# Patient Record
Sex: Male | Born: 1970 | Race: White | Hispanic: No | Marital: Married | Smoking: Former smoker
Health system: Southern US, Community
[De-identification: ages and names within clinical notes are randomized; demographics above are authoritative.]

## PROBLEM LIST (undated history)

## (undated) ENCOUNTER — Ambulatory Visit

## (undated) DIAGNOSIS — E119 Type 2 diabetes mellitus without complications: Secondary | ICD-10-CM

## (undated) DIAGNOSIS — F32A Depression, unspecified: Secondary | ICD-10-CM

## (undated) DIAGNOSIS — K219 Gastro-esophageal reflux disease without esophagitis: Secondary | ICD-10-CM

## (undated) DIAGNOSIS — I10 Essential (primary) hypertension: Secondary | ICD-10-CM

## (undated) DIAGNOSIS — F329 Major depressive disorder, single episode, unspecified: Secondary | ICD-10-CM

## (undated) DIAGNOSIS — E7800 Pure hypercholesterolemia, unspecified: Secondary | ICD-10-CM

## (undated) DIAGNOSIS — F419 Anxiety disorder, unspecified: Secondary | ICD-10-CM

## (undated) DIAGNOSIS — Z8719 Personal history of other diseases of the digestive system: Secondary | ICD-10-CM

## (undated) HISTORY — PX: HERNIA REPAIR: SHX51

## (undated) HISTORY — DX: Type 2 diabetes mellitus without complications: E11.9

## (undated) HISTORY — PX: CHOLECYSTECTOMY: SHX55

---

## 1995-01-09 HISTORY — PX: APPENDECTOMY: SHX54

## 1998-06-14 ENCOUNTER — Encounter: Payer: Self-pay | Admitting: Orthopedic Surgery

## 1998-06-14 ENCOUNTER — Ambulatory Visit (HOSPITAL_COMMUNITY): Admission: RE | Admit: 1998-06-14 | Discharge: 1998-06-14 | Payer: Self-pay | Admitting: Orthopedic Surgery

## 1998-07-05 ENCOUNTER — Ambulatory Visit (HOSPITAL_COMMUNITY): Admission: RE | Admit: 1998-07-05 | Discharge: 1998-07-05 | Payer: Self-pay | Admitting: Orthopedic Surgery

## 1998-07-05 ENCOUNTER — Encounter: Payer: Self-pay | Admitting: Orthopedic Surgery

## 1998-07-19 ENCOUNTER — Encounter: Payer: Self-pay | Admitting: Orthopedic Surgery

## 1998-07-19 ENCOUNTER — Ambulatory Visit (HOSPITAL_COMMUNITY): Admission: RE | Admit: 1998-07-19 | Discharge: 1998-07-19 | Payer: Self-pay | Admitting: Orthopedic Surgery

## 2003-03-02 ENCOUNTER — Emergency Department (HOSPITAL_COMMUNITY): Admission: EM | Admit: 2003-03-02 | Discharge: 2003-03-02 | Payer: Self-pay | Admitting: *Deleted

## 2004-01-13 ENCOUNTER — Encounter: Admission: RE | Admit: 2004-01-13 | Discharge: 2004-01-13 | Payer: Self-pay | Admitting: Internal Medicine

## 2004-03-10 ENCOUNTER — Emergency Department (HOSPITAL_COMMUNITY): Admission: EM | Admit: 2004-03-10 | Discharge: 2004-03-10 | Payer: Self-pay | Admitting: Emergency Medicine

## 2004-03-23 ENCOUNTER — Ambulatory Visit (HOSPITAL_COMMUNITY): Admission: RE | Admit: 2004-03-23 | Discharge: 2004-03-23 | Payer: Self-pay | Admitting: General Surgery

## 2007-07-24 ENCOUNTER — Emergency Department (HOSPITAL_COMMUNITY): Admission: EM | Admit: 2007-07-24 | Discharge: 2007-07-24 | Payer: Self-pay | Admitting: Emergency Medicine

## 2008-08-19 ENCOUNTER — Emergency Department (HOSPITAL_COMMUNITY): Admission: EM | Admit: 2008-08-19 | Discharge: 2008-08-19 | Payer: Self-pay | Admitting: Emergency Medicine

## 2010-04-15 LAB — DIFFERENTIAL
Basophils Absolute: 0 10*3/uL (ref 0.0–0.1)
Lymphocytes Relative: 19 % (ref 12–46)
Monocytes Absolute: 0.5 10*3/uL (ref 0.1–1.0)
Neutro Abs: 4.8 10*3/uL (ref 1.7–7.7)
Neutrophils Relative %: 73 % (ref 43–77)

## 2010-04-15 LAB — POCT CARDIAC MARKERS
CKMB, poc: <1 ng/mL — ABNORMAL LOW (ref 1.0–8.0)
Myoglobin, poc: 118 ng/mL (ref 12–200)
Troponin i, poc: <0.05 ng/mL (ref 0.00–0.09)

## 2010-04-15 LAB — CBC
Hemoglobin: 15.2 g/dL (ref 13.0–17.0)
Platelets: 140 10*3/uL — ABNORMAL LOW (ref 150–400)
RDW: 13.6 % (ref 11.5–15.5)

## 2010-04-15 LAB — BASIC METABOLIC PANEL
Calcium: 9.6 mg/dL (ref 8.4–10.5)
Creatinine, Ser: 1.08 mg/dL (ref 0.4–1.5)
GFR calc Af Amer: >60 mL/min (ref >60)
GFR calc non Af Amer: >60 mL/min (ref >60)
Glucose, Bld: 100 mg/dL — ABNORMAL HIGH (ref 70–99)
Sodium: 140 mEq/L (ref 135–145)

## 2010-05-23 NOTE — Consult Note (Signed)
NAME:  LAVARIUS, DOUGHTEN NO.:  [id number]   MEDICAL RECORD NO.:  [id number]          PATIENT TYPE:  EMS   LOCATION:  ED                            FACILITY:  [facility]   PHYSICIAN:  Thad Ranger, MD       DATE OF BIRTH:  March 24, 1970   DATE OF CONSULTATION:  DATE OF DISCHARGE:                                 CONSULTATION   PRIMARY CARE PHYSICIAN:  Catalina Pizza, MD   CHIEF COMPLAINT:  Chest pain.   HISTORY OF PRESENT ILLNESS:  Mr. Jenniges is a 40 year old male with  medical history of hypertension, hyperlipidemia, tobacco use, and  obesity.  He presented with chief complaint of chest pain.  History was  provided by the patient until he was seen and examined in the ER.  Per  the patient, he had sudden onset of left-sided neck and jaw pain while  he was working.  Subsequently, the pain radiated to his left chest and  left arm.  He did notice some numbness and tingling in his left arm as  well.  He described the pain as 7/10 intensity, throbbing, and  intermittent in nature.  The pain was episodic in nature and lasted  about 2 hours.  He did have some palpitations and lightheadedness during  the pain.  He otherwise denied any nausea or vomiting, any shortness of  breath, or any syncopal episode.  He denied any slurring of speech or  any headache or confusion at the time.  The patient's chest pain and the  symptoms were relieved in the ER spontaneously.  Per the patient, this  is the third episode he had in the previous month.  His relevant risk  factors are positive for hypertension, hyperlipidemia, tobacco abuse,  and obesity.  The patient has been also using diet pills to lose weight  and per the patient, he was taken off his medications for hypertension  and hyperlipidemia in April by his PCP.   REVIEW OF SYSTEMS:  A 10-point review of systems is negative or  otherwise dictated in the HPI.   PAST MEDICAL HISTORY:  Hypertension, hyperlipidemia; however, the  patient is  not on any medication, tobacco abuse.   PAST SURGICAL HISTORY:  Appendectomy, cholecystectomy, and hernia  repair.   SOCIAL HISTORY:  The patient dips tobacco about 1-2 cans every day for  the last 1 year.  Previously, he had quit smoking for 3 years and smoked  about 1 pack per day for about 3-4 years.  He denies any alcohol or any  drug use.  He lives at home with his family and is ambulatory without  any assistance.   ALLERGIES:  No known drug allergies.   MEDICATIONS:  None except AndroGel pump daily.   PHYSICAL EXAMINATION:  VITAL SIGNS:  Temp 98.3, pulse 88, respiratory  rate 20, O2 sat 95% on room air.  GENERAL:  The patient is alert, awake, and oriented x3; not in any acute  distress.  HEENT:  Anicteric sclerae, pale conjunctivae.  Pupils reactive to light  and accommodation.  EOMI.  NECK:  Supple.  No lymphadenopathy.  No JVD.  CVS:  S1 and S2 clear.  Regular rate and rhythm.  CHEST:  Clear to auscultation bilaterally.  Nontender.  No rales,  rhonchi, or wheezing noted.  ABDOMEN:  Soft, nontender, nondistended.  Normal bowel sounds.  EXTREMITIES:  No cyanosis, clubbing, or edema noted in upper or lower  extremities.  NEUROLOGIC:  Alert, awake, and oriented x3.  Cranial nerves II-XII  intact.  Motor strength intact in all extremities.  PSYCHIATRIC:  Alert, oriented x3.  No abnormalities of mood or affect  noted.  SKIN:  No rashes noted.  No decubitus noted.  GU:  No Foley catheter noted.   LABORATORY DIAGNOSTIC DATA:  White count 6.6, hemoglobin 15.2,  hematocrit 43.6.  Sodium 140, potassium 4.0, chloride 105, BUN and  creatinine 11 and 1.08, troponin less than 0.05.   EKG; rate 84, normal sinus rhythm.  Nonspecific ST-T wave changes per  the ER attending; however, no EKG is available to me for review.   ASSESSMENT AND PLAN:  Mr. Spychalski is a 40 year old male presenting to  the ER with chest pain.  The patient does have more than 3 risk factors  being  hypertension, hyperlipidemia, tobacco use, and obesity, had  anginal episode less than 24 hours.  His TIMI score is low at 2.  However, given the recurrent episodes in the last 1 month, it is  reasonable to have stress test done or a cardiac evaluation.  The  patient was explained in detail that he needs to be monitored to rule  out MI on a tele monitor as well as cardiac evaluation for possible  stress test.  The patient, however, refused all the above interventions  and decided to leave against medical advice.  He was explained the risk  of recurrent chest pain episodes, MI, or death.  The patient understands  the risk factors, however, decided to sign against medical advice.      Thad Ranger, MD  Electronically Signed     RR/MEDQ  D:  08/19/2008  T:  08/20/2008  Job:  [id number]   cc:   Catalina Pizza, M.D.  Fax: [phone]

## 2010-05-26 NOTE — Op Note (Signed)
NAMETAVAUGHN, SILGUERO NO.:  [id number]   MEDICAL RECORD NO.:  [id number]          PATIENT TYPE:  AMB   LOCATION:  DAY                          FACILITY:  [facility]   PHYSICIAN:  Angelia Mould. Derrell Lolling, M.D.DATE OF BIRTH:  02/12/1970   DATE OF PROCEDURE:  03/23/2004  DATE OF DISCHARGE:                                 OPERATIVE REPORT   PREOPERATIVE DIAGNOSIS:  Left inguinal hernia.   POSTOPERATIVE DIAGNOSIS:  Left inguinal hernia.   OPERATION PERFORMED:  Repair of left inguinal hernia with mesh Armanda Heritage  repair).   SURGEON:  Angelia Mould. Derrell Lolling, M.D.   OPERATIVE INDICATIONS:  This is a 40 year old white male who has known he  has a left inguinal hernia for many years.  He recently was exercising at  the gym and developed severe pain in his left groin.  He came to the  emergency room, where he was found to have a left inguinal hernia, but it  was soft and easily reducible, and there was no evidence of incarceration.  He was actually feeling fine.  He was advised to have elective surgery.  He  is brought to the operating room electively today for repair of his large  left inguinal hernia.   OPERATIVE TECHNIQUE:  Following the induction of general endotracheal  anesthesia, the patient's left groin and genitalia were prepped and draped  in a sterile fashion.  He was given intravenous antibiotics, and 0.5%  Marcaine with epinephrine was used as a local infiltration anesthetic.  An  oblique incision was made in the left groin overlying the inguinal canal.  Dissection was carried down through the subcutaneous tissue exposing the  aponeurosis of the external oblique.  The external oblique was incised in  the direction of its fibers, opening up the external inguinal ring.  The  external oblique was dissected away from the underlying tissues inferiorly  and superiorly, and self-retaining retractors were placed.  Cord structures  were identified and were rather bulky.  We  had to do some dissection  medially until we could get around the cord structures and encircle them  with a Penrose drain.  Some sensory nerves were traced laterally and  identified, as they penetrated the abdominal muscles about 1-1/2 inches  lateral to the internal ring.  They were clamped, the nerve was divided and  ligated with 2-0 silk ties.  I feel like I probably tied off the  ilioinguinal nerve and perhaps a small branch of the iliohypogastric to  prevent neuroma formation and neuritis.   A fairly slow and tedious dissection of the cord structures followed.  I  incised and skeletonized all of the cremasteric muscle fibers.  I found a  huge indirect hernia sac.  He also had a small bulge medial to the cord  structures, suggesting a small direct component to his hernia.  The  significant hernia was the huge indirect hernia sac.  This was dissected all  the way down to just above the testicle, but it did separate from the  testicle completely.  This sac was then dissected all the way back to the  internal ring and separated from the cord structures.  The sac was opened  and inspected and found to be a simple sight.  There were no contents caught  in it at this point in time.  The sac was then twisted and suture-ligated at  the level of the internal ring with a suture ligature of 2-0 silk.  The  redundant sac was excised and discarded.  The wound was irrigated with  saline.  There was no bleeding.  The floor of the inguinal canal was  repaired and reinforced with an Onley graft of polypropylene mesh.  A 3 x 6  inch piece of mesh was brought to the operative field and trimmed somewhat  at the corners, but most of the mesh was required to cover the floor of the  inguinal canal.  The mesh was sutured in place with running sutures of 2-0  Prolene and interrupted sutures of 2-0 Prolene.  The mesh was sutured, so as  to generously overlap the fascia at the pubic tubercle with some  interrupted  sutures medially and superomedially.  The mesh was sutured to the inguinal  ligament with a running suture of 2-0 Prolene.  Superiorly and  superolaterally, a running suture of 2-0 Prolene was used to affix the mesh  to the abdominal wall muscles.  The mesh was incised laterally so as to wrap  around the cord structures at the internal ring.  The tails of the mesh were  overlapped laterally, and the suture lines completed.  A couple of sutures  were placed laterally and one suture placed medially to reinforce the  closure of the mesh around the cord structures.  This provided a very secure  repair, both medial and lateral to the internal ring but allowed an adequate  opening to the cord structures.  The wound was irrigated with saline.  The  repair was inspected and looked good.  The external oblique was closed with  a running suture of 2-0 Vicryl, placing the cord structures deep to the  external oblique.  Scarpa's fascia was closed with 3-0 Vicryl sutures, and  the skin closed with skin staples.  Clean bandages were placed, and the  patient was taken to the recovery room in stable condition.  Estimated blood  loss was about 10-20 cc.  Complications were none.  Sponge, needle, and  instrument counts were correct.      HMI/MEDQ  D:  03/23/2004  T:  03/23/2004  Job:  [id number]   cc:   Ike Bene, M.D.  [address]  [place]  [place] [address]  Fax: [phone]

## 2010-05-26 NOTE — Consult Note (Signed)
NAME:  Andrew Campbell, SHEA NO.:  [id number]   MEDICAL RECORD NO.:  [id number]          PATIENT TYPE:  EMS   LOCATION:  ED                           FACILITY:  [facility]   PHYSICIAN:  Angelia Mould. Derrell Lolling, M.D.DATE OF BIRTH:  [birth year]   DATE OF CONSULTATION:  DATE OF DISCHARGE:                                   CONSULTATION   REASON FOR CONSULTATION:  Evaluate acute left groin pain and hernia.   HISTORY OF PRESENT ILLNESS:  This is a 40 year old white male who has known  that he had a left inguinal hernia for at least 15 years.  He states that it  generally resides in his scrotum but does not really interfere with his work  much.  He has had some pain there from time to time but has been reluctant  to have an operation.  Yesterday, he was exercising at the gym, doing sit-  ups and abdominal crunches, and he felt a sudden lower abdominal pain.  He  got nauseated and vomited once.  It concerned him because the hernia was  reduced, and it normally is in the scrotum.  He went to work this morning  and then went to see Dr. Merril Abbe.  Dr. Merril Abbe was concerned there was  something wrong with his left inguinal hernia and asked me to evaluate him  in the emergency room.   Patient states that he is comfortable now.  The hernia is reduced.  His  abdominal pain has resolved.  He has no discomfort as long as he is relaxing  in the bed.  He states that he is hungry.   PAST MEDICAL HISTORY:  1.  Hypertension.  2.  Status post appendectomy.  3.  Status post laparoscopic cholecystectomy.  4.  Obesity.   CURRENT MEDICATIONS:  1.  Micardis 1 tablet daily.  2.  Ultracet p.r.n. (ruptured disk).   DRUG ALLERGIES:  No known.   FAMILY HISTORY:  Mother living.  Has von Willebrand's disease and atrial  fibrillation.  Father living.  Has hypertension and abdominal aortic  aneurysm.   SOCIAL HISTORY:  The patient is married, has two children.  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  He quit smoking one year ago.  He denies  the use of alcohol or other drugs.   REVIEW OF SYSTEMS:  All systems are reviewed.  They are noncontributory,  except as described above.   PHYSICAL EXAMINATION:  VITAL SIGNS:  Temp 97.4, pulse 87, respirations 20,  blood pressure 161/87.  GENERAL:  A very pleasant young man in no distress.  Overweight.  HEENT:  Eyes:  Sclerae are anicteric.  Extraocular movements are intact.  Ears, nose, and throat:  Nose, lips, tongue, and oropharynx without gross  lesions.  NECK:  Supple.  Nontender.  No mass.  LUNGS:  Clear to auscultation.  No chest wall tenderness.  HEART:  Regular rate and rhythm.  No murmur.  Pulses are palpable.  ABDOMEN:  Somewhat obese.  Soft, nontender, nondistended.  Liver and spleen  not enlarged.  Active bowel sounds.  No umbilical hernia.  GENITOURINARY:  The patient has a left inguinal hernia that extends into the  scrotum.  It is soft.  It is easily reducible.  It is nontender.  There is  no edema or overlying skin change.  The testicles feel normal.  There is no  evidence of hernia on the right with him examined supine.  EXTREMITIES:  He moves all four extremities well without pain or deformity.  NEUROLOGIC:  No gross motor or sensory deficits.   DATA ORDERED/REVIEWED:  None.   ASSESSMENT:  1.  Indirect left inguinal hernia, moderately large:  This may have been the      cause of his pain, but at this time, there is no evidence of acute      incarceration or strangulation.  I also doubt that he has diverticulitis      or other intra-abdominal acute process.  2.  Hypertension.  3.  Status post cholecystectomy.  4.  Status post appendectomy.   PLAN:  1.  Patient will be allowed to be discharged home on light duty.  No heavy      lifting or sports.  2.  He will return to see me in 4-5 days for followup in the office and the      intent is to schedule him electively for repair of his left inguinal      hernia  at that time.  3.  I have discussed the details and indications of the surgery with him.      Techniques of repair and disability issues were outlined.  He seems to      understand this and seems motivated towards having this done.   I discussed the patient's care and disposition and plan with Dr. Ike Bene.      HMI/MEDQ  D:  03/10/2004  T:  03/10/2004  Job:  [id number]   cc:   Ike Bene, M.D.  [address]  [place]  [place] [address]  Fax: [phone]

## 2011-06-13 ENCOUNTER — Encounter (HOSPITAL_COMMUNITY): Payer: Self-pay

## 2011-06-13 ENCOUNTER — Encounter (HOSPITAL_COMMUNITY)
Admission: RE | Admit: 2011-06-13 | Discharge: 2011-06-13 | Disposition: A | Discharge status: Home / Self Care | Payer: PRIVATE HEALTH INSURANCE | Source: Ambulatory Visit | Attending: General Surgery | Admitting: General Surgery

## 2011-06-13 ENCOUNTER — Encounter (HOSPITAL_COMMUNITY): Payer: Self-pay | Admitting: Pharmacy Technician

## 2011-06-13 HISTORY — DX: Anxiety disorder, unspecified: F41.9

## 2011-06-13 HISTORY — DX: Major depressive disorder, single episode, unspecified: F32.9

## 2011-06-13 HISTORY — DX: Depression, unspecified: F32.A

## 2011-06-13 HISTORY — DX: Personal history of other diseases of the digestive system: Z87.19

## 2011-06-13 HISTORY — DX: Essential (primary) hypertension: I10

## 2011-06-13 HISTORY — DX: Pure hypercholesterolemia, unspecified: E78.00

## 2011-06-13 HISTORY — DX: Gastro-esophageal reflux disease without esophagitis: K21.9

## 2011-06-13 LAB — CBC
Hemoglobin: 12.9 g/dL — ABNORMAL LOW (ref 13.0–17.0)
MCH: 27.7 pg (ref 26.0–34.0)
MCV: 82.2 fL (ref 78.0–100.0)
RBC: 4.65 MIL/uL (ref 4.22–5.81)
WBC: 6.2 10*3/uL (ref 4.0–10.5)

## 2011-06-13 LAB — DIFFERENTIAL
Eosinophils Absolute: 0.1 10*3/uL (ref 0.0–0.7)
Eosinophils Relative: 2 % (ref 0–5)
Lymphocytes Relative: 25 % (ref 12–46)
Lymphs Abs: 1.6 10*3/uL (ref 0.7–4.0)
Monocytes Relative: 8 % (ref 3–12)

## 2011-06-13 LAB — BASIC METABOLIC PANEL
CO2: 26 mEq/L (ref 19–32)
Chloride: 102 mEq/L (ref 96–112)
Glucose, Bld: 96 mg/dL (ref 70–99)
Potassium: 3.9 mEq/L (ref 3.5–5.1)
Sodium: 138 mEq/L (ref 135–145)

## 2011-06-13 LAB — SURGICAL PCR SCREEN: Staphylococcus aureus: NEGATIVE

## 2011-06-13 NOTE — Patient Instructions (Addendum)
20 Andrew Campbell  06/13/2011   Your procedure is scheduled on:   06/18/2011  Report to [facility] at  915  AM.  Call this number if you have problems the morning of surgery: [phone]   Remember:   Do not eat food:After Midnight.  May have clear liquids:until Midnight . Marland Kitchen  Take these medicines the morning of surgery with A SIP OF WATER: none   Do not wear jewelry, make-up or nail polish.  Do not wear lotions, powders, or perfumes. You may wear deodorant.  Do not shave 48 hours prior to surgery. Men may shave face and neck.  Do not bring valuables to the hospital.  Contacts, dentures or bridgework may not be worn into surgery.  Leave suitcase in the car. After surgery it may be brought to your room.  For patients admitted to the hospital, checkout time is 11:00 AM the day of discharge.   Patients discharged the day of surgery will not be allowed to drive home.  Name and phone number of your driver: family  Special Instructions: CHG Shower Use Special Wash: 1/2 bottle night before surgery and 1/2 bottle morning of surgery.   Please read over the following fact sheets that you were given: Pain Booklet, MRSA Information, Surgical Site Infection Prevention, Anesthesia Post-op Instructions and Care and Recovery After Surgery PATIENT INSTRUCTIONS POST-ANESTHESIA  IMMEDIATELY FOLLOWING SURGERY:  Do not drive or operate machinery for the first twenty four hours after surgery.  Do not make any important decisions for twenty four hours after surgery or while taking narcotic pain medications or sedatives.  If you develop intractable nausea and vomiting or a severe headache please notify your doctor immediately.  FOLLOW-UP:  Please make an appointment with your surgeon as instructed. You do not need to follow up with anesthesia unless specifically instructed to do so.  WOUND CARE INSTRUCTIONS (if applicable):  Keep a dry clean dressing on the anesthesia/puncture wound site if there is drainage.   Once the wound has quit draining you may leave it open to air.  Generally you should leave the bandage intact for twenty four hours unless there is drainage.  If the epidural site drains for more than 36-48 hours please call the anesthesia department.  QUESTIONS?:  Please feel free to call your physician or the hospital operator if you have any questions, and they will be happy to assist you.

## 2011-06-17 NOTE — H&P (Signed)
  NTS SOAP Note  Vital Signs:  Vitals as of: 06/07/2011: Systolic 139: Diastolic 87: Heart Rate 80: Temp 99.10F: Height 75ft 2in: Weight 255Lbs 0 Ounces: Pain Level 4: BMI 33  BMI : 32.74 kg/m2  Subjective: This 2 Years 82 Months old Male presents for of gluteal cyst.  patient returns with intermittent episodes of drainage from the cyst. No correlation with bowel movements. Drainage is occasionally bloody. Patient also has occasional tenderness around this area. No fevers or chills.  Review of Symptoms:  Constitutional:unremarkable   Head:unremarkable    Eyes:unremarkable   Nose/Mouth/Throat:unremarkable Cardiovascular:  unremarkable   Respiratory:unremarkable   Gastrointestinal:  unremarkable   Genitourinary:unremarkable     Musculoskeletal:unremarkable   as per history of present illness Breast:unremarkable   Hematolgic/Lymphatic:unremarkable     Allergic/Immunologic:unremarkable     Past Medical History:    Reviewed   Past Medical History  Surgical History: none Medical Problems: none Psychiatric History: none Allergies: no known drug allergies Medications: none   Social History:Reviewed  Social History  Preferred Language: English ([place]) Race:  White Ethnicity: Not Hispanic / Latino Age: 41 Years 4 Months Marital Status:  M Alcohol: denies Recreational drug(s): none   Smoking Status: Unknown if ever smoked  Family History:  Reviewed   Family History  Is there a family history [id number]    Objective Information: General:  Well appearing, well nourished in no distress. Skin:     no rash or prominent lesions Head:Atraumatic; no masses; no abnormalities Eyes:  conjunctiva clear, EOM intact, PERRL Mouth:  Mucous membranes moist, no mucosal lesions. Neck:  Supple without lymphadenopathy.  Heart:  RRR, no murmur Lungs:    CTA bilaterally, no wheezes, rhonchi, rales.   Breathing unlabored. Abdomen:Soft, NT/ND, no HSM, no masses.   no masses. On the gluteal fold there is a noted small nodule. This is mobile and nontender. No drainage.  Assessment:  Diagnosis &amp; Procedure: DiagnosisCode: 782.2, ProcedureCode: 30865,    Plan: gluteal cyst. Surgical options were again discussed with patient. Patient does wish to proceed at this time with excision. We'll schedule the patient forsurgical excision at his convenience  Patient Education:Alternative treatments to surgery were discussed with patient (and family).  Risks and benefits  of procedure were fully explained to the patient (and family) who gave informed consent. Patient/family questions were addressed.  Follow-up:Pending Surgery

## 2011-06-18 ENCOUNTER — Encounter (HOSPITAL_COMMUNITY)
Admission: RE | Disposition: A | Discharge status: Home / Self Care | Payer: Self-pay | Source: Ambulatory Visit | Attending: General Surgery

## 2011-06-18 ENCOUNTER — Ambulatory Visit (HOSPITAL_COMMUNITY): Payer: PRIVATE HEALTH INSURANCE | Admitting: Anesthesiology

## 2011-06-18 ENCOUNTER — Ambulatory Visit (HOSPITAL_COMMUNITY)
Admission: RE | Admit: 2011-06-18 | Discharge: 2011-06-18 | Disposition: A | Discharge status: Home / Self Care | Payer: PRIVATE HEALTH INSURANCE | Source: Ambulatory Visit | Attending: General Surgery | Admitting: General Surgery

## 2011-06-18 ENCOUNTER — Encounter (HOSPITAL_COMMUNITY): Payer: Self-pay | Admitting: Anesthesiology

## 2011-06-18 ENCOUNTER — Encounter (HOSPITAL_COMMUNITY): Payer: Self-pay | Admitting: *Deleted

## 2011-06-18 DIAGNOSIS — K603 Anal fistula, unspecified: Secondary | ICD-10-CM

## 2011-06-18 DIAGNOSIS — Z01812 Encounter for preprocedural laboratory examination: Secondary | ICD-10-CM | POA: Insufficient documentation

## 2011-06-18 HISTORY — PX: ANAL FISTULECTOMY: SHX1139

## 2011-06-18 SURGERY — FISTULECTOMY, ANAL
Anesthesia: General | Site: Anus | Laterality: Right | Wound class: Contaminated

## 2011-06-18 MED ORDER — CEFAZOLIN SODIUM 1-5 GM-% IV SOLN
INTRAVENOUS | Status: AC
  Administered 2011-06-18: 2 g via INTRAVENOUS
  Filled 2011-06-18: qty 50

## 2011-06-18 MED ORDER — LIDOCAINE HCL (PF) 1 % IJ SOLN
INTRAMUSCULAR | Status: AC
  Filled 2011-06-18: qty 5

## 2011-06-18 MED ORDER — LACTATED RINGERS IV SOLN
INTRAVENOUS | Status: DC | PRN
  Administered 2011-06-18: 12:00:00 via INTRAVENOUS

## 2011-06-18 MED ORDER — SODIUM CHLORIDE 0.9 % IR SOLN
Status: DC | PRN
  Administered 2011-06-18: 1000 mL

## 2011-06-18 MED ORDER — CEFAZOLIN SODIUM 1-5 GM-% IV SOLN: 1.0000 g | INTRAVENOUS | Status: DC

## 2011-06-18 MED ORDER — ONDANSETRON HCL 4 MG/2ML IJ SOLN
INTRAMUSCULAR | Status: AC
  Filled 2011-06-18: qty 2

## 2011-06-18 MED ORDER — ONDANSETRON HCL 4 MG/2ML IJ SOLN
INTRAMUSCULAR | Status: DC | PRN
  Administered 2011-06-18: 4 mg via INTRAVENOUS

## 2011-06-18 MED ORDER — FENTANYL CITRATE 0.05 MG/ML IJ SOLN
INTRAMUSCULAR | Status: AC
  Administered 2011-06-18: 50 ug via INTRAVENOUS
  Filled 2011-06-18: qty 2

## 2011-06-18 MED ORDER — BUPIVACAINE HCL (PF) 0.5 % IJ SOLN
INTRAMUSCULAR | Status: DC | PRN
  Administered 2011-06-18: 10 mL

## 2011-06-18 MED ORDER — PROPOFOL 10 MG/ML IV EMUL
INTRAVENOUS | Status: AC
  Filled 2011-06-18: qty 20

## 2011-06-18 MED ORDER — ONDANSETRON HCL 4 MG/2ML IJ SOLN: 4.0000 mg | Freq: Once | INTRAMUSCULAR | Status: DC | PRN

## 2011-06-18 MED ORDER — ONDANSETRON HCL 4 MG/2ML IJ SOLN
4.0000 mg | Freq: Once | INTRAMUSCULAR | Status: AC
  Administered 2011-06-18: 4 mg via INTRAVENOUS

## 2011-06-18 MED ORDER — MIDAZOLAM HCL 2 MG/2ML IJ SOLN
INTRAMUSCULAR | Status: AC
  Filled 2011-06-18: qty 2

## 2011-06-18 MED ORDER — CELECOXIB 100 MG PO CAPS
ORAL_CAPSULE | ORAL | Status: AC
  Administered 2011-06-18: 400 mg via ORAL
  Filled 2011-06-18: qty 4

## 2011-06-18 MED ORDER — MIDAZOLAM HCL 5 MG/5ML IJ SOLN
INTRAMUSCULAR | Status: DC | PRN
  Administered 2011-06-18: 2 mg via INTRAVENOUS

## 2011-06-18 MED ORDER — CELECOXIB 100 MG PO CAPS
400.0000 mg | ORAL_CAPSULE | Freq: Every day | ORAL | Status: AC
  Administered 2011-06-18: 400 mg via ORAL

## 2011-06-18 MED ORDER — LACTATED RINGERS IV SOLN
INTRAVENOUS | Status: DC
  Administered 2011-06-18: 11:00:00 via INTRAVENOUS

## 2011-06-18 MED ORDER — HYDROCODONE-ACETAMINOPHEN 5-325 MG PO TABS: 1.0000 | ORAL_TABLET | ORAL | Status: AC | PRN

## 2011-06-18 MED ORDER — FENTANYL CITRATE 0.05 MG/ML IJ SOLN
INTRAMUSCULAR | Status: DC | PRN
  Administered 2011-06-18 (×2): 50 ug via INTRAVENOUS

## 2011-06-18 MED ORDER — FENTANYL CITRATE 0.05 MG/ML IJ SOLN
25.0000 ug | INTRAMUSCULAR | Status: DC | PRN
  Administered 2011-06-18 (×4): 50 ug via INTRAVENOUS

## 2011-06-18 MED ORDER — BUPIVACAINE HCL (PF) 0.5 % IJ SOLN
INTRAMUSCULAR | Status: AC
  Filled 2011-06-18: qty 30

## 2011-06-18 MED ORDER — PROPOFOL 10 MG/ML IV EMUL
INTRAVENOUS | Status: DC | PRN
  Administered 2011-06-18: 180 mg via INTRAVENOUS

## 2011-06-18 MED ORDER — MIDAZOLAM HCL 2 MG/2ML IJ SOLN
1.0000 mg | INTRAMUSCULAR | Status: DC | PRN
  Administered 2011-06-18: 2 mg via INTRAVENOUS

## 2011-06-18 MED ORDER — CEFAZOLIN SODIUM 1-5 GM-% IV SOLN
INTRAVENOUS | Status: AC
  Filled 2011-06-18: qty 50

## 2011-06-18 SURGICAL SUPPLY — 40 items
APL SKNCLS STERI-STRIP NONHPOA (GAUZE/BANDAGES/DRESSINGS) ×2
BAG HAMPER (MISCELLANEOUS) ×3 IMPLANT
BENZOIN TINCTURE PRP APPL 2/3 (GAUZE/BANDAGES/DRESSINGS) ×3 IMPLANT
CLOTH BEACON ORANGE TIMEOUT ST (SAFETY) ×3 IMPLANT
COVER LIGHT HANDLE STERIS (MISCELLANEOUS) ×6 IMPLANT
DURAPREP 26ML APPLICATOR (WOUND CARE) ×1 IMPLANT
ELECT NDL TIP 2.8 STRL (NEEDLE) IMPLANT
ELECT NEEDLE TIP 2.8 STRL (NEEDLE) IMPLANT
ELECT REM PT RETURN 9FT ADLT (ELECTROSURGICAL) ×3
ELECTRODE REM PT RTRN 9FT ADLT (ELECTROSURGICAL) ×2 IMPLANT
FORMALIN 10 PREFIL 120ML (MISCELLANEOUS) ×3 IMPLANT
GLOVE BIOGEL PI IND STRL 7.0 (GLOVE) ×1 IMPLANT
GLOVE BIOGEL PI IND STRL 7.5 (GLOVE) ×2 IMPLANT
GLOVE BIOGEL PI INDICATOR 7.0 (GLOVE) ×1
GLOVE BIOGEL PI INDICATOR 7.5 (GLOVE) ×1
GLOVE ECLIPSE 7.0 STRL STRAW (GLOVE) ×3 IMPLANT
GOWN STRL REIN XL XLG (GOWN DISPOSABLE) ×6 IMPLANT
KIT ROOM TURNOVER APOR (KITS) ×3 IMPLANT
MANIFOLD NEPTUNE II (INSTRUMENTS) ×3 IMPLANT
NDL HYPO 18GX1.5 BLUNT FILL (NEEDLE) IMPLANT
NDL HYPO 25X1 1.5 SAFETY (NEEDLE) ×1 IMPLANT
NEEDLE HYPO 18GX1.5 BLUNT FILL (NEEDLE) IMPLANT
NEEDLE HYPO 25X1 1.5 SAFETY (NEEDLE) ×3 IMPLANT
NS IRRIG 1000ML POUR BTL (IV SOLUTION) ×3 IMPLANT
PACK MINOR (CUSTOM PROCEDURE TRAY) ×3 IMPLANT
PAD ARMBOARD 7.5X6 YLW CONV (MISCELLANEOUS) ×3 IMPLANT
SET BASIN LINEN APH (SET/KITS/TRAYS/PACK) ×3 IMPLANT
SOL PREP PROV IODINE SCRUB 4OZ (MISCELLANEOUS) IMPLANT
SPONGE GAUZE 4X4 12PLY (GAUZE/BANDAGES/DRESSINGS) ×2 IMPLANT
STRIP CLOSURE SKIN 1/2X4 (GAUZE/BANDAGES/DRESSINGS) ×3 IMPLANT
SUT CHROMIC 3 0 SH 27 (SUTURE) ×2 IMPLANT
SUT ETHILON 3 0 FSL (SUTURE) ×2 IMPLANT
SUT MNCRL AB 4-0 PS2 18 (SUTURE) ×3 IMPLANT
SUT PROLENE 3 0 PS 1 (SUTURE) IMPLANT
SUT VIC AB 3-0 SH 27 (SUTURE) ×3
SUT VIC AB 3-0 SH 27X BRD (SUTURE) ×1 IMPLANT
SWAB CULTURE LIQ STUART DBL (MISCELLANEOUS) ×3 IMPLANT
SYR BULB IRRIGATION 50ML (SYRINGE) ×3 IMPLANT
SYR CONTROL 10ML LL (SYRINGE) ×3 IMPLANT
TUBE ANAEROBIC PORT A CUL  W/M (MISCELLANEOUS) ×2 IMPLANT

## 2011-06-18 NOTE — Anesthesia Procedure Notes (Signed)
Procedure Name: LMA Insertion Date/Time: 06/18/2011 12:32 PM Performed by: Sharlene Dory E Pre-anesthesia Checklist: Patient identified, Emergency Drugs available, Suction available, Patient being monitored and Timeout performed Patient Re-evaluated:Patient Re-evaluated prior to inductionOxygen Delivery Method: Circle system utilized Preoxygenation: Pre-oxygenation with 100% oxygen Intubation Type: IV induction Ventilation: Mask ventilation without difficulty LMA: LMA inserted LMA Size: 4.0 Number of attempts: 1 Placement Confirmation: positive ETCO2 and breath sounds checked- equal and bilateral Tube secured with: Tape Dental Injury: Teeth and Oropharynx as per pre-operative assessment

## 2011-06-18 NOTE — Op Note (Signed)
Patient:  Andrew Campbell  DOB:  Dec 03, 1970  MRN:  [id number]   Preop Diagnosis:  Perirectal cyst  Postop Diagnosis:  Anal fistula  Procedure:  Exam under anesthesia and fistulectomy  Surgeon:  Dr. Tilford Pillar  Anes:  General endotracheal, 0.5% Sensorcaine plain  Indications:  Patient is a 41 year old male presented to my office with a return it intermittent draining cyst in the right gluteal region. This had been excised before and demonstrated evidence of the cyst formation. Risks benefits alternatives of excision were discussed at length patient including but not limited to risk of bleeding, infection, recurrence. I did also briefly discussed with the patient the possibility of a anal fistula and possible treatment options. His questions and concerns were addressed the patient was consented for the planned procedure.  Procedure note:  Patient was taken to the or was placed in supine position the or table time the general anesthetic is a Optician, dispensing. Once patient was asleep symmetrically intubated by the nurse anesthetist. At this point patient's placed into a lithotomy position and his perineum and rectum were prepped with Betadine solution. Drapes are placed in standard fashion. An elliptical incision was created around the suspected cyst with a 15 blade scalpel. Additional dissection down to subcuticular tissues carried out using electrocautery. During this dissection I did enter into the 1 at that time was felt to be a cyst and had a large amount of purulent discharge obtained. Cultures were obtained at this time. I then used a lacrimal duct probe to gently probe the cavity. This advanced without any difficulty into the distal rectum/anus. The course was quite superficial. I did open the area and both ends of the fistulous tract. I further used a curet to scrape the midportion of the fistulous tract. I was able to advance some of the mucosa on the anal side and closed this with interrupted  3-0 chromic suture. I irrigated the gluteal wound with sterile saline. I was quite pleased with the dissection at this time. The skin edges at this site were closed with 3-0 nylon in simple or fashion. The skin was washed dried moist dry towel. Sterile dressings were placed and held temporarily in place with Medipore tape. The drapes removed mesh on the or was placed. Patient was allowed to mild general anesthetic was transferred to the PACU in stable condition. At the conclusion of procedure all instrument, sponge, needle counts are correct. Patient tolerated procedure extremely well.  Complications:  None apparent  EBL:  Less than 50 ML's  Specimen:  Anaerobic and aerobic cultures.

## 2011-06-18 NOTE — Discharge Instructions (Signed)

## 2011-06-18 NOTE — Anesthesia Preprocedure Evaluation (Signed)
Anesthesia Evaluation  Patient identified by MRN, date of birth, ID band Patient awake    Reviewed: Allergy & Precautions, H&P , NPO status , Patient's Chart, lab work & pertinent test results  Airway Mallampati: I      Dental  (+) Teeth Intact   Pulmonary neg pulmonary ROS,  breath sounds clear to auscultation        Cardiovascular hypertension, Pt. on medications Rhythm:Regular Rate:Normal     Neuro/Psych PSYCHIATRIC DISORDERS Anxiety Depression    GI/Hepatic Hiatal hernia: no longer has reflux after esoph dilation.,   Endo/Other    Renal/GU      Musculoskeletal   Abdominal   Peds  Hematology   Anesthesia Other Findings   Reproductive/Obstetrics                           Anesthesia Physical Anesthesia Plan  ASA: II  Anesthesia Plan: General   Post-op Pain Management:    Induction: Intravenous  Airway Management Planned: LMA  Additional Equipment:   Intra-op Plan:   Post-operative Plan: Extubation in OR  Informed Consent: I have reviewed the patients History and Physical, chart, labs and discussed the procedure including the risks, benefits and alternatives for the proposed anesthesia with the patient or authorized representative who has indicated his/her understanding and acceptance.     Plan Discussed with:   Anesthesia Plan Comments:         Anesthesia Quick Evaluation

## 2011-06-18 NOTE — Anesthesia Postprocedure Evaluation (Signed)
  Anesthesia Post-op Note  Patient: Andrew Campbell  Procedure(s) Performed: Procedure(s) (LRB): FISTULECTOMY ANAL (Right)  Patient Location: PACU  Anesthesia Type: General  Level of Consciousness: awake, alert  and oriented  Airway and Oxygen Therapy: Patient Spontanous Breathing and Patient connected to face mask oxygen  Post-op Pain: none  Post-op Assessment: Post-op Vital signs reviewed, PATIENT'S CARDIOVASCULAR STATUS UNSTABLE, Respiratory Function Stable, Patent Airway and No signs of Nausea or vomiting  Post-op Vital Signs: Reviewed and stable  Complications: No apparent anesthesia complications

## 2011-06-18 NOTE — Interval H&P Note (Signed)
History and Physical Interval Note:  06/18/2011 11:40 AM  Andrew Campbell  has presented today for surgery, with the diagnosis of gluteal abscess  The various methods of treatment have been discussed with the patient and family. After consideration of risks, benefits and other options for treatment, the patient has consented to  Procedure(s) (LRB): CYST REMOVAL (N/A) as a surgical intervention .  The patients' history has been reviewed, patient examined, no change in status, stable for surgery.  I have reviewed the patients' chart and labs.  Questions were answered to the patient's satisfaction.     [name]

## 2011-06-18 NOTE — Preoperative (Signed)
Beta Blockers   Reason not to administer Beta Blockers:Not Applicable 

## 2011-06-18 NOTE — Transfer of Care (Signed)
Immediate Anesthesia Transfer of Care Note  Patient: Andrew Campbell  Procedure(s) Performed: Procedure(s) (LRB): FISTULECTOMY ANAL (Right)  Patient Location: PACU  Anesthesia Type: General  Level of Consciousness: awake, alert  and oriented  Airway & Oxygen Therapy: Patient Spontanous Breathing and Patient connected to face mask oxygen  Post-op Assessment: Report given to PACU RN, Post -op Vital signs reviewed and stable and Patient moving all extremities X 4  Post vital signs: Reviewed and stable  Complications: No apparent anesthesia complications

## 2011-06-20 ENCOUNTER — Encounter (HOSPITAL_COMMUNITY): Payer: Self-pay | Admitting: General Surgery

## 2011-06-22 LAB — CULTURE, ROUTINE-ABSCESS: Culture: NO GROWTH

## 2011-06-23 LAB — ANAEROBIC CULTURE

## 2012-12-10 ENCOUNTER — Telehealth (HOSPITAL_COMMUNITY): Payer: Self-pay | Admitting: Emergency Medicine

## 2012-12-10 NOTE — Telephone Encounter (Signed)
Left message at 2nd number.

## 2012-12-12 NOTE — Telephone Encounter (Signed)
Left two messages without return call.

## 2015-07-19 ENCOUNTER — Ambulatory Visit: Payer: PRIVATE HEALTH INSURANCE | Admitting: Allergy and Immunology

## 2015-08-15 ENCOUNTER — Encounter (INDEPENDENT_AMBULATORY_CARE_PROVIDER_SITE_OTHER): Payer: Self-pay

## 2015-08-15 ENCOUNTER — Ambulatory Visit (INDEPENDENT_AMBULATORY_CARE_PROVIDER_SITE_OTHER): Payer: PRIVATE HEALTH INSURANCE | Admitting: Allergy and Immunology

## 2015-08-15 ENCOUNTER — Encounter: Payer: Self-pay | Admitting: Allergy and Immunology

## 2015-08-15 VITALS — BP 148/100 | HR 83 | Temp 98.2°F | Resp 18 | Ht 74.0 in | Wt 282.6 lb

## 2015-08-15 DIAGNOSIS — H1010 Acute atopic conjunctivitis, unspecified eye: Secondary | ICD-10-CM | POA: Insufficient documentation

## 2015-08-15 DIAGNOSIS — R062 Wheezing: Secondary | ICD-10-CM | POA: Insufficient documentation

## 2015-08-15 DIAGNOSIS — I10 Essential (primary) hypertension: Secondary | ICD-10-CM | POA: Diagnosis not present

## 2015-08-15 DIAGNOSIS — J3089 Other allergic rhinitis: Secondary | ICD-10-CM

## 2015-08-15 DIAGNOSIS — H1013 Acute atopic conjunctivitis, bilateral: Secondary | ICD-10-CM

## 2015-08-15 MED ORDER — ALBUTEROL SULFATE 108 (90 BASE) MCG/ACT IN AEPB: 2.0000 | INHALATION_SPRAY | RESPIRATORY_TRACT | 1 refills | Status: DC | PRN

## 2015-08-15 MED ORDER — OLOPATADINE HCL 0.7 % OP SOLN: 1.0000 [drp] | OPHTHALMIC | 5 refills | Status: DC

## 2015-08-15 MED ORDER — AZELASTINE-FLUTICASONE 137-50 MCG/ACT NA SUSP
NASAL | 3 refills | Status: DC
Start: 2015-08-15 — End: 2022-02-14

## 2015-08-15 MED ORDER — EPINEPHRINE 0.3 MG/0.3ML IJ SOAJ: 0.3000 mg | Freq: Once | INTRAMUSCULAR | 1 refills | Status: AC

## 2015-08-15 MED ORDER — LEVOCETIRIZINE DIHYDROCHLORIDE 5 MG PO TABS: ORAL_TABLET | ORAL | 2 refills | Status: AC

## 2015-08-15 NOTE — Patient Instructions (Addendum)
Perennial and seasonal allergic rhinitis  Aeroallergen avoidance measures have been discussed and provided in written form.  A prescription has been provided for Dymista (azelastine/fluticasone) nasal spray, 1 spray per nostril twice daily as needed. Proper nasal spray technique has been discussed and demonstrated.  Nasal saline lavage (NeilMed) as needed has been recommended along with instructions for proper administration.  A prescription has been provided for levocetirizine, 5 mg daily as needed. The risks and benefits of aeroallergen immunotherapy have been discussed. The patient is motivated to initiate immunotherapy if insurance coverage is favorable. He will let Andrew Campbell know how he would like to proceed.  Allergic conjunctivitis  Treatment plan as outlined above for allergic rhinitis.  A prescription has been provided for Pazeo, one drop per eye daily as needed.  Intermittent coughing/wheezing  A prescription has been provided for ProAir Respiclick, 1-2 inhalations every 4-6 hours as needed.  Subjective and objective measures of pulmonary function will be followed and the treatment plan will be adjusted accordingly.  Hypertension  The importance of compliance with prescribed medications has been discussed and emphasized.  Restart losartan-hydrochlorothiazide as prescribed.   Return in about 3 months (around 11/15/2015), or if symptoms worsen or fail to improve.  Reducing Pollen Exposure  The American Academy of Allergy, Asthma and Immunology suggests the following steps to reduce your exposure to pollen during allergy seasons.    1. Do not hang sheets or clothing out to dry; pollen may collect on these items. 2. Do not mow lawns or spend time around freshly cut grass; mowing stirs up pollen. 3. Keep windows closed at night.  Keep car windows closed while driving. 4. Minimize morning activities outdoors, a time when pollen counts are usually at their highest. 5. Stay indoors  as much as possible when pollen counts or humidity is high and on windy days when pollen tends to remain in the air longer. 6. Use air conditioning when possible.  Many air conditioners have filters that trap the pollen spores. 7. Use a HEPA room air filter to remove pollen form the indoor air you breathe.   Control of House Dust Mite Allergen  House dust mites play a major role in allergic asthma and rhinitis.  They occur in environments with high humidity wherever human skin, the food for dust mites is found. High levels have been detected in dust obtained from mattresses, pillows, carpets, upholstered furniture, bed covers, clothes and soft toys.  The principal allergen of the house dust mite is found in its feces.  A gram of dust may contain 1,000 mites and 250,000 fecal particles.  Mite antigen is easily measured in the air during house cleaning activities.    1. Encase mattresses, including the box spring, and pillow, in an air tight cover.  Seal the zipper end of the encased mattresses with wide adhesive tape. 2. Wash the bedding in water of 130 degrees Farenheit weekly.  Avoid cotton comforters/quilts and flannel bedding: the most ideal bed covering is the dacron comforter. 3. Remove all upholstered furniture from the bedroom. 4. Remove carpets, carpet padding, rugs, and non-washable window drapes from the bedroom.  Wash drapes weekly or use plastic window coverings. 5. Remove all non-washable stuffed toys from the bedroom.  Wash stuffed toys weekly. 6. Have the room cleaned frequently with a vacuum cleaner and a damp dust-mop.  The patient should not be in a room which is being cleaned and should wait 1 hour after cleaning before going into the room. 7. Close  and seal all heating outlets in the bedroom.  Otherwise, the room will become filled with dust-laden air.  An electric heater can be used to heat the room. Reduce indoor humidity to less than 50%.  Do not use a humidifier.  Control  of Dog or Cat Allergen  Avoidance is the best way to manage a dog or cat allergy. If you have a dog or cat and are allergic to dog or cats, consider removing the dog or cat from the home. If you have a dog or cat but don't want to find it a new home, or if your family wants a pet even though someone in the household is allergic, here are some strategies that may help keep symptoms at bay:  1. Keep the pet out of your bedroom and restrict it to only a few rooms. Be advised that keeping the dog or cat in only one room will not limit the allergens to that room. 2. Don't pet, hug or kiss the dog or cat; if you do, wash your hands with soap and water. 3. High-efficiency particulate air (HEPA) cleaners run continuously in a bedroom or living room can reduce allergen levels over time. 4. Regular use of a high-efficiency vacuum cleaner or a central vacuum can reduce allergen levels. 5. Giving your dog or cat a bath at least once a week can reduce airborne allergen.  Control of Mold Allergen  Mold and fungi can grow on a variety of surfaces provided certain temperature and moisture conditions exist.  Outdoor molds grow on plants, decaying vegetation and soil.  The major outdoor mold, Alternaria and Cladosporium, are found in very high numbers during hot and dry conditions.  Generally, a late Summer - Fall peak is seen for common outdoor fungal spores.  Rain will temporarily lower outdoor mold spore count, but counts rise rapidly when the rainy period ends.  The most important indoor molds are Aspergillus and Penicillium.  Dark, humid and poorly ventilated basements are ideal sites for mold growth.  The next most common sites of mold growth are the bathroom and the kitchen.  Outdoor [employer] 1. Use air conditioning and keep windows closed 2. Avoid exposure to decaying vegetation. 3. Avoid leaf raking. 4. Avoid grain handling. 5. Consider wearing a face mask if working in moldy areas.  Indoor Mold  Control 1. Maintain humidity below 50%. 2. Clean washable surfaces with 5% bleach solution. 3. Remove sources e.g. Contaminated carpets.

## 2015-08-15 NOTE — Assessment & Plan Note (Signed)
   Treatment plan as outlined above for allergic rhinitis.  A prescription has been provided for Pazeo, one drop per eye daily as needed. 

## 2015-08-15 NOTE — Assessment & Plan Note (Addendum)
   Aeroallergen avoidance measures have been discussed and provided in written form.  A prescription has been provided for Dymista (azelastine/fluticasone) nasal spray, 1 spray per nostril twice daily as needed. Proper nasal spray technique has been discussed and demonstrated.  Nasal saline lavage (NeilMed) as needed has been recommended along with instructions for proper administration.  A prescription has been provided for levocetirizine, 5 mg daily as needed. The risks and benefits of aeroallergen immunotherapy have been discussed. The patient is motivated to initiate immunotherapy if insurance coverage is favorable. He will let us know how he would like to proceed.

## 2015-08-15 NOTE — Assessment & Plan Note (Signed)
   The importance of compliance with prescribed medications has been discussed and emphasized.  Restart losartan-hydrochlorothiazide as prescribed.

## 2015-08-15 NOTE — Assessment & Plan Note (Signed)
   A prescription has been provided for ProAir Respiclick, 1-2 inhalations every 4-6 hours as needed.  Subjective and objective measures of pulmonary function will be followed and the treatment plan will be adjusted accordingly. 

## 2015-08-15 NOTE — Progress Notes (Signed)
New Patient Note  RE: Andrew Campbell MRN: [id number] DOB: 1970/03/28 Date of Office Visit: 08/15/2015  Referring provider: Benita Stabile, MD Primary care provider: Dwana Melena, MD  Chief Complaint: Allergic Rhinitis ; Sinusitis; and Wheezing   History of present illness: Andrew Campbell is a 45 y.o. male presenting today for consultation of rhinosinusitis.  He complains of frequent nasal congestion, rhinorrhea, sneezing, thick postnasal drainage, ocular pruritus, and sinus pressure over the forehead and over the cheekbones.  These symptoms occur year around but are most prominent in the springtime and summertime.  Specific triggers include exposure to pollens and certain dogs.  Over-the-counter antihistamines have failed to adequately relieve his symptoms.  He reports that over the past several years he has experienced 3 or 4 sinus infections per year requiring antibiotics.  When he has upper respiratory tract infections it "goes straight to the chest" causing productive cough, chest tightness, dyspnea, and wheezing.  He does not recall having experienced significant lower respiratory symptoms in the absence of an upper respiratory tract infection.  He has a history of hypertension but discontinued his antihypertensive medications approximately 2 weeks ago for unclear reasons.   Assessment and plan: Perennial and seasonal allergic rhinitis  Aeroallergen avoidance measures have been discussed and provided in written form.  A prescription has been provided for Dymista (azelastine/fluticasone) nasal spray, 1 spray per nostril twice daily as needed. Proper nasal spray technique has been discussed and demonstrated.  Nasal saline lavage (NeilMed) as needed has been recommended along with instructions for proper administration.  A prescription has been provided for levocetirizine, 5 mg daily as needed. The risks and benefits of aeroallergen immunotherapy have been discussed. The patient is  motivated to initiate immunotherapy if insurance coverage is favorable. He will let us know how he would like to proceed.  Allergic conjunctivitis  Treatment plan as outlined above for allergic rhinitis.  A prescription has been provided for Pazeo, one drop per eye daily as needed.  Wheezing  A prescription has been provided for ProAir Respiclick, 1-2 inhalations every 4-6 hours as needed.  Subjective and objective measures of pulmonary function will be followed and the treatment plan will be adjusted accordingly.  Hypertension  The importance of compliance with prescribed medications has been discussed and emphasized.  Restart losartan-hydrochlorothiazide as prescribed.   Meds ordered this encounter  Medications  . Azelastine-Fluticasone 137-50 MCG/ACT SUSP    Sig: 1 Spray per nostril twice daily as needed.    Dispense:  1 Bottle    Refill:  3  . levocetirizine (XYZAL) 5 MG tablet    Sig: Take one tablet daily as needed.    Dispense:  30 tablet    Refill:  2  . Olopatadine HCl (PAZEO) 0.7 % SOLN    Sig: Place 1 drop into both eyes 1 day or 1 dose.    Dispense:  1 Bottle    Refill:  5  . Albuterol Sulfate (PROAIR RESPICLICK) 108 (90 Base) MCG/ACT AEPB    Sig: Inhale 2 puffs into the lungs every 4 (four) hours as needed.    Dispense:  1 each    Refill:  1  . EPINEPHrine 0.3 mg/0.3 mL IJ SOAJ injection    Sig: Inject 0.3 mLs (0.3 mg total) into the muscle once.    Dispense:  2 Device    Refill:  1    Diagnositics: Spirometry: FVC was 3.73 L and FEV1 was 3.14 L without post bronchodilator improvement.  Please see  scanned spirometry results for details. Environmental skin testing: Positive to grass pollen, weed pollen, ragweed pollen, tree pollen, mold, cat hair, dog epithelia, and dust mite antigen.    Physical examination: Blood pressure (!) 148/100, pulse 83, temperature 98.2 F (36.8 C), temperature source Oral, resp. rate 18, height  (1.88 m), weight 282 lb  9.6 oz (128.2 kg), SpO2 96 %.  General: Alert, interactive, in no acute distress. HEENT: TMs pearly gray, turbinates edematous without discharge, post-pharynx erythematous. Neck: Supple without lymphadenopathy. Lungs: Mildly decreased breath sounds bilaterally without wheezing, rhonchi or rales. CV: Normal S1, S2 without murmurs. Abdomen: Nondistended, nontender. Skin: Warm and dry, without lesions or rashes. Extremities:  No clubbing, cyanosis or edema. Neuro:   Grossly intact.  Review of systems:  Review of Systems  Constitutional: Negative for chills, fever and weight loss.  HENT: Positive for congestion. Negative for nosebleeds.   Eyes: Negative for blurred vision.  Respiratory: Positive for cough, sputum production, shortness of breath and wheezing. Negative for hemoptysis.   Cardiovascular: Negative for chest pain.  Gastrointestinal: Negative for constipation and diarrhea.  Genitourinary: Negative for dysuria.  Musculoskeletal: Negative for joint pain and myalgias.  Neurological: Positive for headaches. Negative for dizziness.  Endo/Heme/Allergies: Positive for environmental allergies. Does not bruise/bleed easily.    Past medical history:  Past Medical History:  Diagnosis Date  . Anxiety   . Depression   . GERD (gastroesophageal reflux disease)   . H/O hiatal hernia   . Hypercholesterolemia   . Hypertension     Past surgical history:  Past Surgical History:  Procedure Laterality Date  . ANAL FISTULECTOMY  06/18/2011   Procedure: FISTULECTOMY ANAL;  Surgeon: Fabio Bering, MD;  Location: [facility];  Service: General;  Laterality: Right;  . APPENDECTOMY  1997   Dr Mauricio Po  . CHOLECYSTECTOMY     Dr Mauricio Po  . HERNIA REPAIR     left-[place]    Family history: Family History  Problem Relation Age of Onset  . Hypertension Father   . COPD Father   . Hypertension Sister   . Allergic rhinitis Neg Hx   . Angioedema Neg Hx   . Asthma Neg Hx   . Eczema  Neg Hx   . Immunodeficiency Neg Hx   . Urticaria Neg Hx     Social history: Social History   Social History  . Marital status: Married    Spouse name: N/A  . Number of children: N/A  . Years of education: N/A   Occupational History  . Not on file.   Social History Main Topics  . Smoking status: Former Smoker    Packs/day: 1.00    Years: 3.00    Types: Cigarettes    Quit date: 06/12/2005  . Smokeless tobacco: Current User    Types: Chew  . Alcohol use No  . Drug use: No  . Sexual activity: Yes    Birth control/ protection: None   Other Topics Concern  . Not on file   Social History Narrative  . No narrative on file   Environmental History: The patient lives in a 46 year old house with carpeting in the bedroom and central air/heat.  There is a dog in house which has access to his bedroom.  He is a former cigarette smoker having quit 10 years ago.  He currently uses smokeless tobacco, however he plans to quit in the near future.    Medication List       Accurate as of 08/15/15  6:45 PM. Always use your most recent med list.          Albuterol Sulfate 108 (90 Base) MCG/ACT Aepb Commonly known as:  PROAIR RESPICLICK Inhale 2 puffs into the lungs every 4 (four) hours as needed.   ALPRAZolam 0.5 MG tablet Commonly known as:  XANAX Take 0.5 mg by mouth.   Azelastine-Fluticasone 137-50 MCG/ACT Susp 1 Spray per nostril twice daily as needed.   desvenlafaxine 50 MG 24 hr tablet Commonly known as:  PRISTIQ Take 50 mg by mouth.   EPINEPHrine 0.3 mg/0.3 mL Soaj injection Commonly known as:  EPI-PEN Inject 0.3 mLs (0.3 mg total) into the muscle once.   levocetirizine 5 MG tablet Commonly known as:  XYZAL Take one tablet daily as needed.   losartan 25 MG tablet Commonly known as:  COZAAR Take 25 mg by mouth.   losartan-hydrochlorothiazide 50-12.5 MG tablet Commonly known as:  HYZAAR Take 1 tablet by mouth daily.   multivitamin with minerals Tabs tablet Take  1 tablet by mouth daily.   Olopatadine HCl 0.7 % Soln Commonly known as:  PAZEO Place 1 drop into both eyes 1 day or 1 dose.   TRILIPIX 135 MG capsule Generic drug:  Choline Fenofibrate Take 135 mg by mouth daily.       Known medication allergies: No Known Allergies  I appreciate the opportunity to take part in Breyon's care. Please do not hesitate to contact me with questions.  Sincerely,   R. Jorene Guestarter Mart Colpitts, MD

## 2017-03-19 ENCOUNTER — Encounter: Payer: Self-pay | Admitting: Cardiology

## 2017-03-27 ENCOUNTER — Encounter: Payer: Self-pay | Admitting: Cardiovascular Disease

## 2017-05-20 ENCOUNTER — Telehealth: Payer: Self-pay | Admitting: *Deleted

## 2017-05-20 NOTE — Telephone Encounter (Signed)
[facility] FOR MEDICAL RECORDS TO FAX RECORDS TO [phone] ATT: CHART PREP

## 2017-06-03 NOTE — Progress Notes (Signed)
Cardiology Office Note   Date:  06/07/2017   ID:  Andrew Campbell, DOB November 09, 1970, MRN [id number]  PCP:  Benita Stabile, MD  Cardiologist:   Eufemio Strahm [place], MD   Chief Complaint  Patient presents with  . Chest Pain    sharp pain in chest area and feels a sharp pain on left side of neck when he gets aggravated. Left arm sometimes heavy and weak       History of Present Illness: Andrew Campbell is a 47 y.o. male who is seen at the request of Dr. Margo Aye for evaluation of chest pain. He has a history of HTN, DM, and HLD. He reports that for the past year he has noted intermittent sharp, stabbing pain in the left pectoral region. This is not associated with exertion and is random. Typically lasts a few minutes. Has not taken anything for relief. Also notes for the past few months that his left arm is heavy and fatigued.   He admits he has not taken care of himself. He is depressed. Quit taking all medication for a year or more. A1c was up to 10. Started back on medication about a month ago. States he still is missing some doses at times. 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 Otherwise inactive. Wife reports he snores a lot and has periods of apnea at night. Never tested before for sleep apnea.     Past Medical History:  Diagnosis Date  . Anxiety   . Depression   . Diabetes mellitus without complication (HCC)   . GERD (gastroesophageal reflux disease)   . H/O hiatal hernia   . Hypercholesterolemia   . Hypertension     Past Surgical History:  Procedure Laterality Date  . ANAL FISTULECTOMY  06/18/2011   Procedure: FISTULECTOMY ANAL;  Surgeon: Fabio Bering, MD;  Location: [facility];  Service: General;  Laterality: Right;  . APPENDECTOMY  1997   Dr Mauricio Po  . CHOLECYSTECTOMY     Dr Mauricio Po  . HERNIA REPAIR     left-[place]     Current Outpatient Medications  Medication Sig Dispense Refill  . Albuterol  Sulfate (PROAIR RESPICLICK) 108 (90 Base) MCG/ACT AEPB Inhale 2 puffs into the lungs every 4 (four) hours as needed. 1 each 1  . ALPRAZolam (XANAX) 0.5 MG tablet Take 0.5 mg by mouth.    . Azelastine-Fluticasone 137-50 MCG/ACT SUSP 1 Spray per nostril twice daily as needed. 1 Bottle 3  . Choline Fenofibrate (TRILIPIX) 135 MG capsule Take 135 mg by mouth daily.    Marland Kitchen desvenlafaxine (PRISTIQ) 50 MG 24 hr tablet Take 50 mg by mouth.    . levocetirizine (XYZAL) 5 MG tablet Take one tablet daily as needed. 30 tablet 2  . losartan (COZAAR) 25 MG tablet Take 25 mg by mouth.    . metFORMIN (GLUCOPHAGE) 500 MG tablet Take 500 mg by mouth 2 (two) times daily with a meal.    . Multiple Vitamin (MULITIVITAMIN WITH MINERALS) TABS Take 1 tablet by mouth daily.    . Olopatadine HCl (PAZEO) 0.7 % SOLN Place 1 drop into both eyes 1 day or 1 dose. 1 Bottle 5  . sitaGLIPtin (JANUVIA) 25 MG tablet Take 25 mg by mouth daily.     No current facility-administered medications for this visit.     Allergies:   Patient has no known allergies.    Social History:  The patient  reports that he quit smoking about 11 years ago. His smoking use included cigarettes. He has a 3.00 pack-year smoking history. His smokeless tobacco use includes chew. He reports that he does not drink alcohol or use drugs.   Family History:  The patient's family history includes Asthma in his mother; COPD in his father; Heart disease in his mother; Hypertension in his father, mother, and sister; Lung cancer in his father.    ROS:  Please see the history of present illness.   Otherwise, review of systems are positive for none .   All other systems are reviewed and negative.    PHYSICAL EXAM: VS:  BP (!) 150/100 (BP Location: Right Arm)   Pulse 78   Ht 6' 2.5" (1.892 m)   Wt 266 lb 6.4 oz (120.8 kg)   BMI 33.75 kg/m  , BMI Body mass index is 33.75 kg/m. GEN: Well nourished, obese WM , in no acute distress  HEENT: normal  Neck: no JVD,  carotid bruits, or masses Cardiac: RRR; no murmurs, rubs, or gallops,no edema  Respiratory:  clear to auscultation bilaterally, normal work of breathing GI: soft, nontender, nondistended, + BS MS: no deformity or atrophy  Skin: warm and dry, no rash Neuro:  Strength and sensation are intact Psych: euthymic mood, full affect   EKG:  EKG is ordered today. The ekg ordered today demonstrates NSR with normal Ecg. I have personally reviewed and interpreted this study.    Recent Labs: No results found for requested labs within last 8760 hours.    Lipid Panel No results found for: CHOL, TRIG, HDL, CHOLHDL, VLDL, LDLCALC, LDLDIRECT    Wt Readings from Last 3 Encounters:  06/07/17 266 lb 6.4 oz (120.8 kg)  08/15/15 282 lb 9.6 oz (128.2 kg)  06/13/11 248 lb (112.5 kg)      Other studies Reviewed: Additional studies/ records that were reviewed today include:  Labs dated 02/24/16: cholesterol 168, triglycerides 404, HDL 27. A1c 7%. Hgb, potassium, ALT normal.    ASSESSMENT AND PLAN:  1.  Atypical chest pain. Patient has multiple cardiac risk factors and is therefore at moderate CV risk. I would recommend a ETT to assess CV risk but currently BP is too high. Will add amlodipine 5 mg daily. Once BP is under better control we can arrange stress test. Will arrange follow up in one month.  2. DM type 2 poorly controlled. No on Januvia and metformin per primary care.   3. HTN poorly controlled. Add amlodipine to losartan HCT  4. Hyperlipidemia. High triglycerides. On fenofibrate. Needs significant dietary modification and weight loss.   5. Obesity. Epworth sleep scale 7. Reported heavy snoring and apneic spells by wife. Would consider sleep study. This can be arranged by primary care.   On the one hand the patient states that he wants to be proactive about his health but on the other hand he seems poorly motivated to do so. He states he doesn't want to take all these pills and I think  compliance will be a major issue going forward. Even with good lifestyle modification he will need lifelong treatment for the above issues.    Current medicines are reviewed at length with the patient today.  The patient does not have concerns regarding medicines.  The following changes have been made:  Add amlodipine  Labs/ tests ordered today include: none No orders of the defined types were placed in this encounter.    Disposition:   FU in one month.  If BP improved can arrange ETT.  Signed, Areli Frary [place], MD  06/07/2017 1:46 PM    [facility] [address], [place], [place], [address] Phone [phone], Fax [phone]

## 2017-06-07 ENCOUNTER — Ambulatory Visit (INDEPENDENT_AMBULATORY_CARE_PROVIDER_SITE_OTHER): Payer: PRIVATE HEALTH INSURANCE | Admitting: Cardiology

## 2017-06-07 ENCOUNTER — Encounter: Payer: Self-pay | Admitting: Cardiology

## 2017-06-07 VITALS — BP 150/100 | HR 78 | Ht 74.5 in | Wt 266.4 lb

## 2017-06-07 DIAGNOSIS — I10 Essential (primary) hypertension: Secondary | ICD-10-CM | POA: Diagnosis not present

## 2017-06-07 DIAGNOSIS — R079 Chest pain, unspecified: Secondary | ICD-10-CM | POA: Diagnosis not present

## 2017-06-07 DIAGNOSIS — E119 Type 2 diabetes mellitus without complications: Secondary | ICD-10-CM | POA: Diagnosis not present

## 2017-06-07 DIAGNOSIS — E782 Mixed hyperlipidemia: Secondary | ICD-10-CM | POA: Diagnosis not present

## 2017-06-07 MED ORDER — AMLODIPINE BESYLATE 5 MG PO TABS: 5.0000 mg | ORAL_TABLET | Freq: Every day | ORAL | 3 refills | Status: DC

## 2017-06-07 MED ORDER — LOSARTAN POTASSIUM-HCTZ 100-25 MG PO TABS: 1.0000 | ORAL_TABLET | Freq: Every day | ORAL | Status: AC

## 2017-06-07 NOTE — Patient Instructions (Signed)
We will add amlodipine 5 mg daily for blood pressure  Continue your other therapy.  We will arrange follow up in one month.   Once we have better blood pressure control we will schedule you for a stress test  Continue efforts at lifestyle modification with a heart healthy/diabetic diet, regular exercise and weight loss.

## 2017-07-02 ENCOUNTER — Ambulatory Visit: Payer: PRIVATE HEALTH INSURANCE | Admitting: Physician Assistant

## 2017-08-22 ENCOUNTER — Emergency Department (HOSPITAL_COMMUNITY): Payer: PRIVATE HEALTH INSURANCE

## 2017-08-22 ENCOUNTER — Encounter (HOSPITAL_COMMUNITY): Payer: Self-pay | Admitting: Emergency Medicine

## 2017-08-22 ENCOUNTER — Emergency Department (HOSPITAL_COMMUNITY)
Admission: EM | Admit: 2017-08-22 | Discharge: 2017-08-22 | Disposition: A | Discharge status: Home / Self Care | Payer: PRIVATE HEALTH INSURANCE | Attending: Emergency Medicine | Admitting: Emergency Medicine

## 2017-08-22 ENCOUNTER — Other Ambulatory Visit: Payer: Self-pay

## 2017-08-22 DIAGNOSIS — S20211A Contusion of right front wall of thorax, initial encounter: Secondary | ICD-10-CM | POA: Insufficient documentation

## 2017-08-22 DIAGNOSIS — I10 Essential (primary) hypertension: Secondary | ICD-10-CM | POA: Diagnosis not present

## 2017-08-22 DIAGNOSIS — E119 Type 2 diabetes mellitus without complications: Secondary | ICD-10-CM | POA: Insufficient documentation

## 2017-08-22 DIAGNOSIS — Y998 Other external cause status: Secondary | ICD-10-CM | POA: Insufficient documentation

## 2017-08-22 DIAGNOSIS — Y9389 Activity, other specified: Secondary | ICD-10-CM | POA: Insufficient documentation

## 2017-08-22 DIAGNOSIS — Y929 Unspecified place or not applicable: Secondary | ICD-10-CM | POA: Insufficient documentation

## 2017-08-22 DIAGNOSIS — F419 Anxiety disorder, unspecified: Secondary | ICD-10-CM | POA: Diagnosis not present

## 2017-08-22 DIAGNOSIS — F329 Major depressive disorder, single episode, unspecified: Secondary | ICD-10-CM | POA: Insufficient documentation

## 2017-08-22 DIAGNOSIS — W01190A Fall on same level from slipping, tripping and stumbling with subsequent striking against furniture, initial encounter: Secondary | ICD-10-CM | POA: Diagnosis not present

## 2017-08-22 DIAGNOSIS — S299XXA Unspecified injury of thorax, initial encounter: Secondary | ICD-10-CM | POA: Diagnosis present

## 2017-08-22 DIAGNOSIS — Z87891 Personal history of nicotine dependence: Secondary | ICD-10-CM | POA: Insufficient documentation

## 2017-08-22 DIAGNOSIS — Z9049 Acquired absence of other specified parts of digestive tract: Secondary | ICD-10-CM | POA: Diagnosis not present

## 2017-08-22 MED ORDER — LIDOCAINE 5 % EX PTCH: 1.0000 | MEDICATED_PATCH | CUTANEOUS | 0 refills | Status: DC

## 2017-08-22 MED ORDER — IBUPROFEN 800 MG PO TABS: 800.0000 mg | ORAL_TABLET | Freq: Three times a day (TID) | ORAL | 0 refills | Status: DC | PRN

## 2017-08-22 MED ORDER — IBUPROFEN 800 MG PO TABS
800.0000 mg | ORAL_TABLET | Freq: Once | ORAL | Status: AC
  Administered 2017-08-22: 800 mg via ORAL
  Filled 2017-08-22: qty 1

## 2017-08-22 NOTE — ED Triage Notes (Signed)
Onset 10 minutes ago pt had to crawl in window after locking himself out of the house. Larey SeatFell on a table, felt a pop in right side of chest, pain 7/10

## 2017-08-22 NOTE — ED Provider Notes (Signed)
Emergency Department Provider Note   I have reviewed the triage vital signs and the nursing notes.   HISTORY  Chief Complaint Fall   HPI Andrew Campbell is a 47 y.o. male with PMH of GERD, DM, HLD, HTN resents to the emergency department for evaluation of right lateral chest wall pain.  The patient was crawling through a window after being locked out of his house.  He made through the window okay and then went to jump to his feet when he lost his balance and fell into a table.  The edge of the table hit him in the right chest.  He is having pain with touching the area or deep breathing but denies shortness of breath.  No chest pain prior to the injury.  No pain to other areas.  Denies any head injury or loss of consciousness.   Past Medical History:  Diagnosis Date  . Anxiety   . Depression   . Diabetes mellitus without complication (HCC)   . GERD (gastroesophageal reflux disease)   . H/O hiatal hernia   . Hypercholesterolemia   . Hypertension     Patient Active Problem List   Diagnosis Date Noted  . Perennial and seasonal allergic rhinitis 08/15/2015  . Allergic conjunctivitis 08/15/2015  . Intermittent coughing/wheezing 08/15/2015  . Hypertension 08/15/2015    Past Surgical History:  Procedure Laterality Date  . ANAL FISTULECTOMY  06/18/2011   Procedure: FISTULECTOMY ANAL;  Surgeon: Fabio BeringBrent C Ziegler, MD;  Location: [facility];  Service: General;  Laterality: Right;  . APPENDECTOMY  1997   [facility]  . CHOLECYSTECTOMY     Dr [facility]icio [facility]  . HERNIA REPAIR     left-[place]    Allergies Patient has no known allergies.  Family History  Problem Relation Age of Onset  . Hypertension Father   . COPD Father   . Lung cancer Father   . Hypertension Sister   . Hypertension Mother   . Heart disease Mother   . Asthma Mother   . Allergic rhinitis Neg Hx   . Angioedema Neg Hx   . Eczema Neg Hx   . Immunodeficiency Neg Hx   . Urticaria Neg Hx     Social  History Social History   Tobacco Use  . Smoking status: Former Smoker    Packs/day: 1.00    Years: 3.00    Pack years: 3.00    Types: Cigarettes    Last attempt to quit: 06/12/2005    Years since quitting: 12.2  . Smokeless tobacco: Current User    Types: Chew  Substance Use Topics  . Alcohol use: No  . Drug use: No    Review of Systems  Constitutional: No fever/chills Eyes: No visual changes. ENT: No sore throat. Cardiovascular: Positive right lateral chest pain.  Respiratory: Denies shortness of breath. Gastrointestinal: No abdominal pain.  No nausea, no vomiting.  No diarrhea.  No constipation. Genitourinary: Negative for dysuria. Musculoskeletal: Negative for back pain. Positive right lateral chest wall pain.  Skin: Negative for rash. Neurological: Negative for headaches, focal weakness or numbness.  10-point ROS otherwise negative.  ____________________________________________   PHYSICAL EXAM:  VITAL SIGNS: ED Triage Vitals  Enc Vitals Group     BP 08/22/17 1817 (!) 161/97     Pulse Rate 08/22/17 1817 91     Resp 08/22/17 1817 16     Temp 08/22/17 1817 98.2 F (36.8 C)     Temp Source 08/22/17 1817 Oral  SpO2 08/22/17 1817 99 %     Weight 08/22/17 1820 257 lb (116.6 kg)     Height 08/22/17 1820 6\' 2"  (1.88 m)     Pain Score 08/22/17 1820 7   Constitutional: Alert and oriented. Well appearing and in no acute distress. Eyes: Conjunctivae are normal. Head: Atraumatic. Nose: No congestion/rhinnorhea. Mouth/Throat: Mucous membranes are moist.   Neck: No stridor.   Cardiovascular: Normal rate, regular rhythm. Good peripheral circulation. Grossly normal heart sounds.   Respiratory: Normal respiratory effort.  No retractions. Lungs CTAB. Gastrointestinal: No distention.  Musculoskeletal: No gross deformities of extremities. Focal tenderness to the right lateral chest wall. No bruising or crepitus. Linear erythema overlying the tender area presumably where  the table struck the chest.   Neurologic: No gross focal neurologic deficits are appreciated.  Skin:  Skin is warm, dry and intact. No rash noted.  ____________________________________________  RADIOLOGY  Dg Ribs Unilateral W/chest Right  Result Date: 08/22/2017 CLINICAL DATA:  Fall, right lower rib pain. EXAM: RIGHT RIBS AND CHEST - 3+ VIEW COMPARISON:  08/19/2008 FINDINGS: Heart and mediastinal contours are within normal limits. No focal opacities or effusions. No acute bony abnormality. No visible rib fracture or pneumothorax. IMPRESSION: No active cardiopulmonary disease. Electronically Signed   By: Charlett Nose M.D.   On: 08/22/2017 19:37    ____________________________________________   PROCEDURES  Procedure(s) performed:   Procedures  None  ____________________________________________   INITIAL IMPRESSION / ASSESSMENT AND PLAN / ED COURSE  Pertinent labs & imaging results that were available during my care of the patient were reviewed by me and considered in my medical decision making (see chart for details).  Patient presents to the emergency department with right lateral chest wall pain.  He has focal tenderness to palpation of the chest wall.  No concern for acute coronary syndrome as this occurred in the setting of a fall and injury to this area.  There is a small area of redness over the chest which is presumably where the table struck the chest.  Low suspicion for pneumothorax.  Plan for right rib series and chest x-ray.   Plain film negative for fracture. Provided incentive spirometer and pain medication. Plan for PCP follow up.  At this time, I do not feel there is any life-threatening condition present. I have reviewed and discussed all results (EKG, imaging, lab, urine as appropriate), exam findings with patient. I have reviewed nursing notes and appropriate previous records.  I feel the patient is safe to be discharged home without further emergent workup.  Discussed usual and customary return precautions. Patient and family (if present) verbalize understanding and are comfortable with this plan.  Patient will follow-up with their primary care provider. If they do not have a primary care provider, information for follow-up has been provided to them. All questions have been answered.  ____________________________________________  FINAL CLINICAL IMPRESSION(S) / ED DIAGNOSES  Final diagnoses:  Contusion of right chest wall, initial encounter    MEDICATIONS GIVEN DURING THIS VISIT:  Medications  ibuprofen (ADVIL,MOTRIN) tablet 800 mg (800 mg Oral Given 08/22/17 2028)     NEW OUTPATIENT MEDICATIONS STARTED DURING THIS VISIT:  Discharge Medication List as of 08/22/2017  8:01 PM    START taking these medications   Details  ibuprofen (ADVIL,MOTRIN) 800 MG tablet Take 1 tablet (800 mg total) by mouth every 8 (eight) hours as needed., Starting Thu 08/22/2017, Print    lidocaine (LIDODERM) 5 % Place 1 patch onto the skin daily. Remove &  Discard patch within 12 hours or as directed by MD, Starting Thu 08/22/2017, Print        Note:  This document was prepared using Dragon voice recognition software and may include unintentional dictation errors.  Alona BeneJoshua Jony Ladnier, MD Emergency Medicine    Atif Chapple, Arlyss RepressJoshua G, MD 08/22/17 [phone]

## 2017-08-22 NOTE — Discharge Instructions (Signed)
You have been seen in the Emergency Department (ED) today for chest pain.  As we have discussed today?s test results are normal, and we believe your pain is due to pain/strain and/or inflammation of the muscles and/or cartilage of your chest wall.  We recommend you take ibuprofen 800 mg three times a day with meals for the next 5 days (unless you have been told previously not to take ibuprofen or NSAIDs in general).  You may also take Tylenol according to the label instructions.  Read through the included information for additional treatment recommendations and precautions. ° °Continue to take your regular medications.  ° °Return to the Emergency Department (ED) if you experience any further chest pain/pressure/tightness, difficulty breathing, or sudden sweating, or other symptoms that concern you. ° °

## 2018-01-13 DIAGNOSIS — H6502 Acute serous otitis media, left ear: Secondary | ICD-10-CM | POA: Diagnosis not present

## 2018-01-13 DIAGNOSIS — I10 Essential (primary) hypertension: Secondary | ICD-10-CM | POA: Diagnosis not present

## 2018-01-13 DIAGNOSIS — J302 Other seasonal allergic rhinitis: Secondary | ICD-10-CM | POA: Diagnosis not present

## 2018-03-21 DIAGNOSIS — I10 Essential (primary) hypertension: Secondary | ICD-10-CM | POA: Diagnosis not present

## 2018-03-21 DIAGNOSIS — E782 Mixed hyperlipidemia: Secondary | ICD-10-CM | POA: Diagnosis not present

## 2018-03-21 DIAGNOSIS — E1169 Type 2 diabetes mellitus with other specified complication: Secondary | ICD-10-CM | POA: Diagnosis not present

## 2018-03-21 DIAGNOSIS — E119 Type 2 diabetes mellitus without complications: Secondary | ICD-10-CM | POA: Diagnosis not present

## 2018-03-25 DIAGNOSIS — I10 Essential (primary) hypertension: Secondary | ICD-10-CM | POA: Diagnosis not present

## 2018-03-25 DIAGNOSIS — E782 Mixed hyperlipidemia: Secondary | ICD-10-CM | POA: Diagnosis not present

## 2018-03-25 DIAGNOSIS — E1165 Type 2 diabetes mellitus with hyperglycemia: Secondary | ICD-10-CM | POA: Diagnosis not present

## 2018-03-25 DIAGNOSIS — F411 Generalized anxiety disorder: Secondary | ICD-10-CM | POA: Diagnosis not present

## 2018-05-11 ENCOUNTER — Other Ambulatory Visit: Payer: Self-pay | Admitting: Orthopedic Surgery

## 2018-05-11 DIAGNOSIS — B078 Other viral warts: Secondary | ICD-10-CM | POA: Diagnosis not present

## 2018-05-14 DIAGNOSIS — R2231 Localized swelling, mass and lump, right upper limb: Secondary | ICD-10-CM | POA: Diagnosis not present

## 2018-05-26 DIAGNOSIS — R2231 Localized swelling, mass and lump, right upper limb: Secondary | ICD-10-CM | POA: Diagnosis not present

## 2018-06-25 DIAGNOSIS — I10 Essential (primary) hypertension: Secondary | ICD-10-CM | POA: Diagnosis not present

## 2018-06-25 DIAGNOSIS — K649 Unspecified hemorrhoids: Secondary | ICD-10-CM | POA: Diagnosis not present

## 2018-08-19 DIAGNOSIS — E785 Hyperlipidemia, unspecified: Secondary | ICD-10-CM | POA: Diagnosis not present

## 2018-08-19 DIAGNOSIS — E1169 Type 2 diabetes mellitus with other specified complication: Secondary | ICD-10-CM | POA: Diagnosis not present

## 2018-08-19 DIAGNOSIS — E1165 Type 2 diabetes mellitus with hyperglycemia: Secondary | ICD-10-CM | POA: Diagnosis not present

## 2018-08-19 DIAGNOSIS — I10 Essential (primary) hypertension: Secondary | ICD-10-CM | POA: Diagnosis not present

## 2018-08-19 DIAGNOSIS — E119 Type 2 diabetes mellitus without complications: Secondary | ICD-10-CM | POA: Diagnosis not present

## 2018-08-21 DIAGNOSIS — E669 Obesity, unspecified: Secondary | ICD-10-CM | POA: Diagnosis not present

## 2018-08-21 DIAGNOSIS — I10 Essential (primary) hypertension: Secondary | ICD-10-CM | POA: Diagnosis not present

## 2018-08-21 DIAGNOSIS — E1165 Type 2 diabetes mellitus with hyperglycemia: Secondary | ICD-10-CM | POA: Diagnosis not present

## 2018-08-21 DIAGNOSIS — E782 Mixed hyperlipidemia: Secondary | ICD-10-CM | POA: Diagnosis not present

## 2018-09-17 DIAGNOSIS — R21 Rash and other nonspecific skin eruption: Secondary | ICD-10-CM | POA: Diagnosis not present

## 2018-09-17 DIAGNOSIS — G9009 Other idiopathic peripheral autonomic neuropathy: Secondary | ICD-10-CM | POA: Diagnosis not present

## 2018-10-09 DIAGNOSIS — R2231 Localized swelling, mass and lump, right upper limb: Secondary | ICD-10-CM | POA: Diagnosis not present

## 2018-11-28 ENCOUNTER — Other Ambulatory Visit: Payer: Self-pay

## 2018-11-28 DIAGNOSIS — Z20822 Contact with and (suspected) exposure to covid-19: Secondary | ICD-10-CM

## 2018-12-01 LAB — NOVEL CORONAVIRUS, NAA: SARS-CoV-2, NAA: NOT DETECTED

## 2019-01-14 DIAGNOSIS — E1165 Type 2 diabetes mellitus with hyperglycemia: Secondary | ICD-10-CM | POA: Diagnosis not present

## 2019-01-14 DIAGNOSIS — E119 Type 2 diabetes mellitus without complications: Secondary | ICD-10-CM | POA: Diagnosis not present

## 2019-01-14 DIAGNOSIS — I10 Essential (primary) hypertension: Secondary | ICD-10-CM | POA: Diagnosis not present

## 2019-01-14 DIAGNOSIS — E1169 Type 2 diabetes mellitus with other specified complication: Secondary | ICD-10-CM | POA: Diagnosis not present

## 2019-01-14 DIAGNOSIS — E785 Hyperlipidemia, unspecified: Secondary | ICD-10-CM | POA: Diagnosis not present

## 2019-01-26 DIAGNOSIS — E782 Mixed hyperlipidemia: Secondary | ICD-10-CM | POA: Diagnosis not present

## 2019-01-26 DIAGNOSIS — E1165 Type 2 diabetes mellitus with hyperglycemia: Secondary | ICD-10-CM | POA: Diagnosis not present

## 2019-01-26 DIAGNOSIS — I10 Essential (primary) hypertension: Secondary | ICD-10-CM | POA: Diagnosis not present

## 2019-01-26 DIAGNOSIS — E291 Testicular hypofunction: Secondary | ICD-10-CM | POA: Diagnosis not present

## 2019-05-11 DIAGNOSIS — M25512 Pain in left shoulder: Secondary | ICD-10-CM | POA: Diagnosis not present

## 2019-05-27 DIAGNOSIS — E291 Testicular hypofunction: Secondary | ICD-10-CM | POA: Diagnosis not present

## 2019-05-27 DIAGNOSIS — E559 Vitamin D deficiency, unspecified: Secondary | ICD-10-CM | POA: Diagnosis not present

## 2019-05-27 DIAGNOSIS — E669 Obesity, unspecified: Secondary | ICD-10-CM | POA: Diagnosis not present

## 2019-05-27 DIAGNOSIS — E119 Type 2 diabetes mellitus without complications: Secondary | ICD-10-CM | POA: Diagnosis not present

## 2019-05-27 DIAGNOSIS — E1165 Type 2 diabetes mellitus with hyperglycemia: Secondary | ICD-10-CM | POA: Diagnosis not present

## 2019-05-27 DIAGNOSIS — E1169 Type 2 diabetes mellitus with other specified complication: Secondary | ICD-10-CM | POA: Diagnosis not present

## 2019-06-01 DIAGNOSIS — E1165 Type 2 diabetes mellitus with hyperglycemia: Secondary | ICD-10-CM | POA: Diagnosis not present

## 2019-06-01 DIAGNOSIS — I10 Essential (primary) hypertension: Secondary | ICD-10-CM | POA: Diagnosis not present

## 2019-06-01 DIAGNOSIS — E291 Testicular hypofunction: Secondary | ICD-10-CM | POA: Diagnosis not present

## 2019-06-01 DIAGNOSIS — E782 Mixed hyperlipidemia: Secondary | ICD-10-CM | POA: Diagnosis not present

## 2019-07-13 DIAGNOSIS — M10071 Idiopathic gout, right ankle and foot: Secondary | ICD-10-CM | POA: Diagnosis not present

## 2019-09-04 DIAGNOSIS — M10071 Idiopathic gout, right ankle and foot: Secondary | ICD-10-CM | POA: Diagnosis not present

## 2019-09-24 DIAGNOSIS — J019 Acute sinusitis, unspecified: Secondary | ICD-10-CM | POA: Diagnosis not present

## 2019-10-06 DIAGNOSIS — R05 Cough: Secondary | ICD-10-CM | POA: Diagnosis not present

## 2019-10-06 DIAGNOSIS — K5730 Diverticulosis of large intestine without perforation or abscess without bleeding: Secondary | ICD-10-CM | POA: Diagnosis not present

## 2019-10-06 DIAGNOSIS — K649 Unspecified hemorrhoids: Secondary | ICD-10-CM | POA: Diagnosis not present

## 2019-10-06 DIAGNOSIS — J019 Acute sinusitis, unspecified: Secondary | ICD-10-CM | POA: Diagnosis not present

## 2019-10-06 DIAGNOSIS — Z6832 Body mass index (BMI) 32.0-32.9, adult: Secondary | ICD-10-CM | POA: Diagnosis not present

## 2019-10-06 DIAGNOSIS — F349 Persistent mood [affective] disorder, unspecified: Secondary | ICD-10-CM | POA: Diagnosis not present

## 2019-12-14 DIAGNOSIS — K649 Unspecified hemorrhoids: Secondary | ICD-10-CM | POA: Diagnosis not present

## 2019-12-14 DIAGNOSIS — F349 Persistent mood [affective] disorder, unspecified: Secondary | ICD-10-CM | POA: Diagnosis not present

## 2019-12-14 DIAGNOSIS — K5730 Diverticulosis of large intestine without perforation or abscess without bleeding: Secondary | ICD-10-CM | POA: Diagnosis not present

## 2019-12-14 DIAGNOSIS — E559 Vitamin D deficiency, unspecified: Secondary | ICD-10-CM | POA: Diagnosis not present

## 2019-12-16 DIAGNOSIS — I10 Essential (primary) hypertension: Secondary | ICD-10-CM | POA: Diagnosis not present

## 2019-12-16 DIAGNOSIS — E782 Mixed hyperlipidemia: Secondary | ICD-10-CM | POA: Diagnosis not present

## 2019-12-16 DIAGNOSIS — E1165 Type 2 diabetes mellitus with hyperglycemia: Secondary | ICD-10-CM | POA: Diagnosis not present

## 2019-12-16 DIAGNOSIS — E291 Testicular hypofunction: Secondary | ICD-10-CM | POA: Diagnosis not present

## 2020-01-11 DIAGNOSIS — R1030 Lower abdominal pain, unspecified: Secondary | ICD-10-CM | POA: Diagnosis not present

## 2020-01-25 DIAGNOSIS — U071 COVID-19: Secondary | ICD-10-CM | POA: Diagnosis not present

## 2020-01-25 DIAGNOSIS — R519 Headache, unspecified: Secondary | ICD-10-CM | POA: Diagnosis not present

## 2020-01-25 DIAGNOSIS — R509 Fever, unspecified: Secondary | ICD-10-CM | POA: Diagnosis not present

## 2020-01-25 DIAGNOSIS — R0981 Nasal congestion: Secondary | ICD-10-CM | POA: Diagnosis not present

## 2020-01-29 DIAGNOSIS — U071 COVID-19: Secondary | ICD-10-CM | POA: Diagnosis not present

## 2020-01-29 DIAGNOSIS — R509 Fever, unspecified: Secondary | ICD-10-CM | POA: Diagnosis not present

## 2020-01-29 DIAGNOSIS — R519 Headache, unspecified: Secondary | ICD-10-CM | POA: Diagnosis not present

## 2020-01-29 DIAGNOSIS — R0981 Nasal congestion: Secondary | ICD-10-CM | POA: Diagnosis not present

## 2020-02-01 ENCOUNTER — Encounter: Payer: Self-pay | Admitting: Internal Medicine

## 2020-02-22 DIAGNOSIS — M79641 Pain in right hand: Secondary | ICD-10-CM | POA: Diagnosis not present

## 2020-03-09 DIAGNOSIS — S61210A Laceration without foreign body of right index finger without damage to nail, initial encounter: Secondary | ICD-10-CM | POA: Diagnosis not present

## 2020-03-10 NOTE — Progress Notes (Signed)
Referring Provider: Benita Stabile, MD Primary Care Physician:  Benita Stabile, MD Primary Gastroenterologist:  Dr. Jena Gauss  Chief Complaint  Patient presents with  . Colonoscopy    Last tcs was when he was in 20's  . Diverticulitis    Had few episodes since December. No CT was done. Got better with Cipro and Flagyl    HPI:   Andrew Campbell is a 50 y.o. male presenting today at the request of Benita Stabile, MD for consult colonoscopy and diverticulitis.   History of superficial right anterolateral fistula s/p fistulotomy in 2014 at [facility].   Reviewed information included in referral.  Office visit with PCP 01/11/2020 reporting 3 days of left-sided abdominal pain.  He had previously been seen about 1 month prior with left lower quadrant abdominal pain and was prescribed Cipro and Flagyl.  He did well with this, but only took 2 to 3 days of medication.  He was prescribed Cipro 500 mg twice daily and Flagyl 250 mg 3 times daily x10 days.  Referral was also placed to GI for colonoscopy.  Today:  Had a few episodes of diverticulitis in December. No CT or other abdominal imaging performed. He was treated with cipro and flagyl and had improvement. Last colonoscopy and EGD in his 59s.  Does not remember having GI problems at that time.  Thinks he had polyps and diverticulosis.  Remembers he was having dysphagia and reports his esophagus was stretched.  No abdominal pain. Was told he had IBS. Stools tend to be soft. No watery stool unless he is under stress or if eating spicier foods or eating out. Usually at least 1 BM a day but can be more depending on what he is eating. Abdominal pain prior to BMs that improves thereafter. Sometimes this can be pretty significant. Not all the time.  Used to take align which was helpful.  No brbpr or melena. No unintentional weight loss. Reports his A1C was 15, but he started taking diabetes medications.  Last hemoglobin A1c was 8.9.  Reports he is still  not taking his diabetes medications on a regular basis.    History of anal fistula.  He is not sure why he had this.  He has not had any trouble with this since his surgery in 2013/2014.  No regular GERD symptoms. Admits to dysphagia. About once a week, he feels foods get stuck in his esophagus. Drinks water and items pass. Usually with solid foods but also with soup and crackers. Started a few years ago. No nausea or vomiting.   HTN: Not taking his BP medication. Last took about 3 weeks ago. Checks BP at home every now and then.  If BP is elevated, he takes BP medication, and BP improves. No CP, HA, or vision changes.   Past Medical History:  Diagnosis Date  . Anxiety   . Depression   . Diabetes mellitus without complication (HCC)   . GERD (gastroesophageal reflux disease)   . H/O hiatal hernia   . Hypercholesterolemia   . Hypertension     Past Surgical History:  Procedure Laterality Date  . ANAL FISTULECTOMY  06/18/2011   Procedure: FISTULECTOMY ANAL;  Surgeon: Fabio Bering, MD;  Location: [facility];  Service: General;  Laterality: Right;  . APPENDECTOMY  1997   Dr Mauricio Po  . CHOLECYSTECTOMY     Dr Mauricio Po  . HERNIA REPAIR     left-[place]    Current Outpatient Medications  Medication  Sig Dispense Refill  . Albuterol Sulfate (PROAIR RESPICLICK) 108 (90 Base) MCG/ACT AEPB Inhale 2 puffs into the lungs every 4 (four) hours as needed. 1 each 1  . ALPRAZolam (XANAX) 0.5 MG tablet Take 0.5 mg by mouth as needed.    . Azelastine-Fluticasone 137-50 MCG/ACT SUSP 1 Spray per nostril twice daily as needed. 1 Bottle 3  . dicyclomine (BENTYL) 10 MG capsule Take 1 capsule (10 mg total) by mouth 4 (four) times daily -  before meals and at bedtime. 90 capsule 0  . levocetirizine (XYZAL) 5 MG tablet Take one tablet daily as needed. 30 tablet 2  . sitaGLIPtin-metformin (JANUMET) 50-1000 MG tablet Take 1 tablet by mouth daily.    Marland Kitchen desvenlafaxine (PRISTIQ) 50 MG 24 hr tablet Take  50 mg by mouth. (Patient not taking: Reported on 03/11/2020)    . losartan-hydrochlorothiazide (HYZAAR) 100-25 MG tablet Take 1 tablet by mouth daily. (Patient not taking: Reported on 03/11/2020)     No current facility-administered medications for this visit.    Allergies as of 03/11/2020  . (No Known Allergies)    Family History  Problem Relation Age of Onset  . Hypertension Father   . COPD Father   . Lung cancer Father   . Hypertension Sister   . Hypertension Mother   . Heart disease Mother   . Asthma Mother   . Colon cancer Paternal Grandmother        diagnosed in his 44s.   . Allergic rhinitis Neg Hx   . Angioedema Neg Hx   . Eczema Neg Hx   . Immunodeficiency Neg Hx   . Urticaria Neg Hx   . Inflammatory bowel disease Neg Hx     Social History   Socioeconomic History  . Marital status: Married    Spouse name: Not on file  . Number of children: 2  . Years of education: Not on file  . Highest education level: Not on file  Occupational History  . Not on file  Tobacco Use  . Smoking status: Former Smoker    Packs/day: 1.00    Years: 3.00    Pack years: 3.00    Types: Cigarettes    Quit date: 06/12/2005    Years since quitting: 14.7  . Smokeless tobacco: Current User    Types: Chew  Substance and Sexual Activity  . Alcohol use: Yes    Comment: rarely  . Drug use: Not Currently    Types: Marijuana    Comment: About 20-30 years ago.   Marland Kitchen Sexual activity: Yes    Birth control/protection: None  Other Topics Concern  . Not on file  Social History Narrative  . Not on file   Social Determinants of Health   Financial Resource Strain: Not on file  Food Insecurity: Not on file  Transportation Needs: Not on file  Physical Activity: Not on file  Stress: Not on file  Social Connections: Not on file  Intimate Partner Violence: Not on file    Review of Systems: Gen: Denies any fever, chills, cold or flulike symptoms, lightheadedness, dizziness, presyncope,  syncope. CV: Denies chest pain or palpitations. Resp: Denies shortness of breath or cough. GI: See HPI GU : Denies urinary burning, urinary frequency, urinary hesitancy MS: Chronic knee pain. Receives injections.  Derm: Denies rash Psych: Admits to anxiety Heme: See HPI  Physical Exam: BP (!) 184/100   Pulse 83   Temp (!) 96.8 F (36 C) (Temporal)   Ht 6\' 3"  (1.905 m)  Wt 242 lb 6.4 oz (110 kg)   BMI 30.30 kg/m  General:   Alert and oriented. Pleasant and cooperative. Well-nourished and well-developed.  Head:  Normocephalic and atraumatic. Eyes:  Without icterus, sclera clear and conjunctiva pink.  Lungs:  Clear to auscultation bilaterally. No wheezes, rales, or rhonchi. No distress.  Heart:  S1, S2 present without murmurs appreciated.  Abdomen:  +BS, soft,  and non-distended.  Mild TTP in epigastric and extending to RUQ.  Patient denies having pain in this area aside from palpation. No HSM noted. No guarding or rebound. No masses appreciated.  Rectal:  Deferred  Msk:  Symmetrical without gross deformities. Normal posture. Extremities:  Without edema. Neurologic:  Alert and  oriented x4;  grossly normal neurologically. Skin:  Intact without significant lesions or rashes. Psych:  Normal mood and affect.   Assessment: 50 year old male with history of medication noncompliance, diabetes, HTN, HLD, anxiety, depression presenting today to discuss scheduling colonoscopy, also reporting intermittent loose stools, abdominal pain, and dysphagia.  Colon cancer screening/loose stools: Patient reports having a colonoscopy in his 42s.  Thinks he may have had polyps, but he is not sure.  Denies BRBPR, melena, unintentional weight loss. Treated for suspected diverticulitis in December/January by PCP with resolution of left lower quadrant abdominal pain. He does have chronic history of abdominal pain prior to BMs that improves or after, intermittent loose stools worsened by stress and/or dietary  triggers and reports history of IBS.  Interestingly, he has history of anal fistula s/p fistulotomy in 2013/2014.  No family history of Crohn's disease or ulcerative colitis. Family history significant for paternal grandmother with colon cancer in her 46s.   Suspect patient likely has IBS.  Doubt IBD, but with history of anal fistula, cannot rule this out.  He needs colonoscopy for colon cancer screening and this will also evaluate any other underlying cause of intermittent loose stools and abdominal pain.  Unfortunately, due to medication noncompliance, BP is quite elevated today at 184/100 without alarm symptoms.  Due to elevated BP, I am unable to schedule colonoscopy at this time.  He was advised to resume taking his BP medications and return to our office in 2 weeks for BP recheck.  In the meantime, we will provide him with Bentyl to take as needed for loose stools and abdominal pain.  Dysphagia: Few year history of dysphagia with sensation of foods getting stuck in his esophagus, improved by drinking water.  No regurgitation.  Denies any regular GERD symptoms.  He does report history of dysphagia with EGD and dilation in his 52s.  Suspect he may have esophageal web, ring, or stricture secondary to history of GERD.  We will hold off on starting him on a PPI for now as he reports GERD is rare, but pending EGD findings, may need to consider PPI daily.   Plan: 1.  Bentyl 10 mg as needed for intermittent loose stools and abdominal pain.  Advised to take once daily PRN and increase as needed.  Hold in the setting of constipation. 2.  Advised to resume blood pressure and diabetes medications as prescribed. 3.  See PCP for uncontrolled HTN.  ED precautions discussed. 4.  Avoid fried, fatty, greasy, spicy foods. 5.  Add daily probiotic such as align, digestive advantage, or Phillips colon health. 6.  Return to our office in 2 weeks for BP recheck.  If BP is under better control, we will arrange colonoscopy  and EGD with possible dilation with propofol with Dr.  Rourk. ASA III (uncontrolled diabetes and HTN). 7.  Plan to follow-up after procedures.    Ermalinda MemosKristen Elliyah Liszewski, PA-C [facility] 03/11/2020

## 2020-03-11 ENCOUNTER — Encounter: Payer: Self-pay | Admitting: Gastroenterology

## 2020-03-11 ENCOUNTER — Ambulatory Visit (INDEPENDENT_AMBULATORY_CARE_PROVIDER_SITE_OTHER): Payer: BC Managed Care – PPO | Admitting: Gastroenterology

## 2020-03-11 ENCOUNTER — Other Ambulatory Visit: Payer: Self-pay

## 2020-03-11 VITALS — BP 184/100 | HR 83 | Temp 96.8°F | Ht 75.0 in | Wt 242.4 lb

## 2020-03-11 DIAGNOSIS — Z1211 Encounter for screening for malignant neoplasm of colon: Secondary | ICD-10-CM | POA: Diagnosis not present

## 2020-03-11 DIAGNOSIS — R1310 Dysphagia, unspecified: Secondary | ICD-10-CM | POA: Diagnosis not present

## 2020-03-11 DIAGNOSIS — I10 Essential (primary) hypertension: Secondary | ICD-10-CM | POA: Diagnosis not present

## 2020-03-11 DIAGNOSIS — R195 Other fecal abnormalities: Secondary | ICD-10-CM

## 2020-03-11 MED ORDER — DICYCLOMINE HCL 10 MG PO CAPS: 10.0000 mg | ORAL_CAPSULE | Freq: Three times a day (TID) | ORAL | 0 refills | Status: DC

## 2020-03-11 NOTE — Patient Instructions (Signed)
As her blood pressure is quite elevated today, please take your blood pressure medications as soon as you return home and continue taking this medication as prescribed.  If your blood pressure does not improve and remains elevated above 180/100, you should go to the emergency room.  If you have any chest pain, shortness of breath, headache, blurry vision, dizziness, you should proceed to the emergency room.  You should also follow-up with your primary care on your elevated blood pressure.  Return to our office in 2 weeks for a nurse visit to recheck your blood pressure.  If your blood pressure is improved, we will arrange for a colonoscopy and upper endoscopy with possible dilation of your esophagus with Dr. Jena Gauss.  I am sending in dicyclomine 10 mg to your pharmacy to help with loose stools and abdominal discomfort.  You may take this medication up to 3 times daily as needed.  I recommend you start with once a day when symptoms occur.  Hold in the setting of constipation.  Avoid fried, fatty, greasy, spicy foods.  You may also try taking a daily probiotic as this has previously helped her symptoms.  Align, digestive advantage, or Phillips colon health are good options.  Also resume taking your diabetes medications as prescribed.  We will see you back after your procedure.   Ermalinda Memos, PA-C [facility]

## 2020-05-10 DIAGNOSIS — K5730 Diverticulosis of large intestine without perforation or abscess without bleeding: Secondary | ICD-10-CM | POA: Diagnosis not present

## 2020-05-10 DIAGNOSIS — F349 Persistent mood [affective] disorder, unspecified: Secondary | ICD-10-CM | POA: Diagnosis not present

## 2020-05-10 DIAGNOSIS — Z6832 Body mass index (BMI) 32.0-32.9, adult: Secondary | ICD-10-CM | POA: Diagnosis not present

## 2020-05-10 DIAGNOSIS — E1165 Type 2 diabetes mellitus with hyperglycemia: Secondary | ICD-10-CM | POA: Diagnosis not present

## 2020-05-10 DIAGNOSIS — R079 Chest pain, unspecified: Secondary | ICD-10-CM | POA: Diagnosis not present

## 2020-05-10 DIAGNOSIS — E785 Hyperlipidemia, unspecified: Secondary | ICD-10-CM | POA: Diagnosis not present

## 2020-05-10 DIAGNOSIS — K649 Unspecified hemorrhoids: Secondary | ICD-10-CM | POA: Diagnosis not present

## 2020-05-13 DIAGNOSIS — E782 Mixed hyperlipidemia: Secondary | ICD-10-CM | POA: Diagnosis not present

## 2020-05-13 DIAGNOSIS — D6949 Other primary thrombocytopenia: Secondary | ICD-10-CM | POA: Diagnosis not present

## 2020-05-13 DIAGNOSIS — I10 Essential (primary) hypertension: Secondary | ICD-10-CM | POA: Diagnosis not present

## 2020-05-13 DIAGNOSIS — E1165 Type 2 diabetes mellitus with hyperglycemia: Secondary | ICD-10-CM | POA: Diagnosis not present

## 2020-05-16 ENCOUNTER — Other Ambulatory Visit: Payer: Self-pay

## 2020-05-16 ENCOUNTER — Other Ambulatory Visit (HOSPITAL_COMMUNITY): Payer: Self-pay | Admitting: Family Medicine

## 2020-05-16 ENCOUNTER — Ambulatory Visit (HOSPITAL_COMMUNITY)
Admission: RE | Admit: 2020-05-16 | Discharge: 2020-05-16 | Disposition: A | Discharge status: Home / Self Care | Payer: BC Managed Care – PPO | Source: Ambulatory Visit | Attending: Family Medicine | Admitting: Family Medicine

## 2020-05-16 DIAGNOSIS — R109 Unspecified abdominal pain: Secondary | ICD-10-CM | POA: Diagnosis not present

## 2020-05-16 DIAGNOSIS — Z87442 Personal history of urinary calculi: Secondary | ICD-10-CM

## 2020-06-07 ENCOUNTER — Telehealth: Payer: Self-pay | Admitting: Internal Medicine

## 2020-06-07 DIAGNOSIS — R195 Other fecal abnormalities: Secondary | ICD-10-CM

## 2020-06-07 MED ORDER — DICYCLOMINE HCL 10 MG PO CAPS: 10.0000 mg | ORAL_CAPSULE | Freq: Three times a day (TID) | ORAL | 3 refills | Status: DC

## 2020-06-07 NOTE — Telephone Encounter (Signed)
Pt needs a prescription called into [facility] on his dicyclomine.

## 2020-06-07 NOTE — Telephone Encounter (Signed)
Completed.

## 2020-06-07 NOTE — Telephone Encounter (Signed)
Spoke with pt.  He said that dicyclomine has been working great.  He is out and there are no refills.  Can we send RX to [facility] for pt please?

## 2020-06-07 NOTE — Addendum Note (Signed)
Addended by: Gelene Mink on: 06/07/2020 02:21 PM   Modules accepted: Orders

## 2020-06-17 ENCOUNTER — Telehealth: Payer: Self-pay

## 2020-06-17 NOTE — Telephone Encounter (Signed)
Pt came in office to have BP check. He was last seen 03/11/20 and was advised to return in 2 weeks for BP check prior to scheduling procedure. States he has been putting it off. BP 143/90, heart rate 92. He has been more compliant with taking BP med. Takes Losartan-HCTZ 100-25 once a day. Last night at home BP was 128/78. He has been checking BP at home and ranging 130's systolic and low 26'S diastolic. Advised him we would let him know if procedure can be scheduled or if he needs another OV since he was last seen in March. He is ok having another OV if needed.

## 2020-06-19 NOTE — Telephone Encounter (Signed)
OK to schedule procedures, but we need to verify current medication list to ensure the correct adjustments are made prior to procedures.   Once medication list is verified, I will give orders.

## 2020-06-20 NOTE — Telephone Encounter (Signed)
Updated med list with pt.

## 2020-06-20 NOTE — Telephone Encounter (Signed)
Tried to call pt. VM not set up.  

## 2020-06-21 NOTE — Telephone Encounter (Signed)
[facility] Clinical Pool: Please arrange TCS + EGD +/- dilation with propofol with Dr. Jena Gauss.  ASA III Dx: Colon cancer screening, dysphagia  Medication Adjustments:  1 day prior: One half dose of insulin glargine at bedtime (12 units).  Continue Janumet as prescribed. Day of procedure: No morning diabetes medications.   He needs to check his blood sugar frequently while on clear liquids and correct any low sugars with approved sugary clear liquids.

## 2020-06-22 NOTE — Telephone Encounter (Signed)
Will call pt to schedule once we receive future procedure schedule

## 2020-06-28 ENCOUNTER — Encounter: Payer: Self-pay | Admitting: *Deleted

## 2020-06-28 MED ORDER — CLENPIQ 10-3.5-12 MG-GM -GM/160ML PO SOLN: 1.0000 | Freq: Once | ORAL | 0 refills | Status: AC

## 2020-06-28 NOTE — Telephone Encounter (Signed)
PA for TCS approved via [employer] [id number], DOS 06/28/2020-08/26/2020  PA for EGD/DIL approved via [employer] [id number], DOS 06/28/2020-08/26/2020

## 2020-06-28 NOTE — Addendum Note (Signed)
Addended by: Armstead Peaks on: 06/28/2020 12:29 PM   Modules accepted: Orders

## 2020-06-28 NOTE — Telephone Encounter (Signed)
CALLED PT. He has been scheduled for 8/18 at 8:30am. Aware will mail prep instructions with pre-op appt. He wants a low volume prep sent in. States he will check with pharmacy regarding cost. Advised will send in. Confirmed pharmacy. Confirmed address.

## 2020-06-29 ENCOUNTER — Ambulatory Visit: Payer: BC Managed Care – PPO | Admitting: Cardiovascular Disease

## 2020-06-29 ENCOUNTER — Encounter: Payer: Self-pay | Admitting: Cardiovascular Disease

## 2020-06-29 ENCOUNTER — Other Ambulatory Visit: Payer: Self-pay

## 2020-06-29 DIAGNOSIS — R079 Chest pain, unspecified: Secondary | ICD-10-CM | POA: Diagnosis not present

## 2020-06-29 DIAGNOSIS — I10 Essential (primary) hypertension: Secondary | ICD-10-CM

## 2020-06-29 DIAGNOSIS — G4733 Obstructive sleep apnea (adult) (pediatric): Secondary | ICD-10-CM

## 2020-06-29 DIAGNOSIS — E781 Pure hyperglyceridemia: Secondary | ICD-10-CM

## 2020-06-29 LAB — BASIC METABOLIC PANEL
BUN/Creatinine Ratio: 13 (ref 9–20)
BUN: 12 mg/dL (ref 6–24)
CO2: 26 mmol/L (ref 20–29)
Calcium: 9.9 mg/dL (ref 8.7–10.2)
Chloride: 97 mmol/L (ref 96–106)
Creatinine, Ser: 0.94 mg/dL (ref 0.76–1.27)
Glucose: 217 mg/dL — ABNORMAL HIGH (ref 65–99)
Potassium: 4.8 mmol/L (ref 3.5–5.2)
Sodium: 138 mmol/L (ref 134–144)
eGFR: 98 mL/min/{1.73_m2} (ref >59)

## 2020-06-29 MED ORDER — METOPROLOL TARTRATE 100 MG PO TABS: ORAL_TABLET | ORAL | 0 refills | Status: DC

## 2020-06-29 NOTE — Assessment & Plan Note (Addendum)
History of hypertriglyceridemia with triglycerides over thousand normally now in the 600 range and on Vascepa.  He also was on rosuvastatin.  His most recent lipid profile performed 12/14/2019 revealed a total cholesterol 173, LDL 56, HDL 25 triglyceride level 876.  I am going to refer him to Dr. Rennis Golden for further evaluation and treatment.

## 2020-06-29 NOTE — Progress Notes (Signed)
06/29/2020 Andrew Campbell   01-Aug-1970  [id number]  Primary Physician Benita Stabile, MD Primary Cardiologist: Runell Gess MD Nicholes [place], [place]  HPI:  Andrew Campbell is a 50 y.o. mildly overweight married Caucasian male father of 2 children, grandfather of 2 grandchildren accompanied by his daughter Doneta Public.  He was referred by Dr. Margo Aye, his PCP for evaluation of chest pain.  He does have a history of treated hypertension, diabetes and hyperlipidemia.  He quit smoking 10 years ago after smoking 10 pack years.  There is no family history of heart disease.  He is never had a heart attack or stroke.  He does have symptoms compatible with obstructive sleep apnea including nocturnal snoring and daytime somnolence.  He said chest pain last several weeks it comes several times a week lasting seconds at a time occasionally with left neck and upper extremity radiation.   Current Meds  Medication Sig   Albuterol Sulfate (PROAIR RESPICLICK) 108 (90 Base) MCG/ACT AEPB Inhale 2 puffs into the lungs every 4 (four) hours as needed. (Patient taking differently: Inhale 2 puffs into the lungs as needed.)   ALPRAZolam (XANAX) 0.5 MG tablet Take 0.5 mg by mouth as needed.   Azelastine-Fluticasone 137-50 MCG/ACT SUSP 1 Spray per nostril twice daily as needed.   desvenlafaxine (PRISTIQ) 50 MG 24 hr tablet Take 50 mg by mouth daily.   dicyclomine (BENTYL) 10 MG capsule Take 1 capsule (10 mg total) by mouth 4 (four) times daily -  before meals and at bedtime. (Patient taking differently: Take 10 mg by mouth as needed.)   Insulin Glargine (BASAGLAR KWIKPEN [place]) Inject 24 Units into the skin at bedtime.   levocetirizine (XYZAL) 5 MG tablet Take one tablet daily as needed.   losartan-hydrochlorothiazide (HYZAAR) 100-25 MG tablet Take 1 tablet by mouth daily.   rosuvastatin (CRESTOR) 5 MG tablet Take 5 mg by mouth daily.   sitaGLIPtin-metformin (JANUMET) 50-1000 MG tablet Take 1 tablet by mouth daily.    VASCEPA 1 g capsule Take 2 g by mouth 2 (two) times daily.     No Known Allergies  Social History   Socioeconomic History   Marital status: Married    Spouse name: Not on file   Number of children: 2   Years of education: Not on file   Highest education level: Not on file  Occupational History   Not on file  Tobacco Use   Smoking status: Former    Packs/day: 1.00    Years: 3.00    Pack years: 3.00    Types: Cigarettes    Quit date: 06/12/2005    Years since quitting: 15.0   Smokeless tobacco: Current    Types: Chew  Substance and Sexual Activity   Alcohol use: Yes    Comment: rarely   Drug use: Not Currently    Types: Marijuana    Comment: About 20-30 years ago.    Sexual activity: Yes    Birth control/protection: None  Other Topics Concern   Not on file  Social History Narrative   Not on file   Social Determinants of Health   Financial Resource Strain: Not on file  Food Insecurity: Not on file  Transportation Needs: Not on file  Physical Activity: Not on file  Stress: Not on file  Social Connections: Not on file  Intimate Partner Violence: Not on file     Review of Systems: General: negative for chills, fever, night sweats or weight changes.  Cardiovascular: negative for chest pain, dyspnea on exertion, edema, orthopnea, palpitations, paroxysmal nocturnal dyspnea or shortness of breath Dermatological: negative for rash Respiratory: negative for cough or wheezing Urologic: negative for hematuria Abdominal: negative for nausea, vomiting, diarrhea, bright red blood per rectum, melena, or hematemesis Neurologic: negative for visual changes, syncope, or dizziness All other systems reviewed and are otherwise negative except as noted above.    Blood pressure (!) 142/96, pulse 70, height 6\' 2"  (1.88 m), weight 232 lb 12.8 oz (105.6 kg).  General appearance: alert and no distress Neck: no adenopathy, no carotid bruit, no JVD, supple, symmetrical, trachea  midline, and thyroid not enlarged, symmetric, no tenderness/mass/nodules Lungs: clear to auscultation bilaterally Heart: regular rate and rhythm, S1, S2 normal, no murmur, click, rub or gallop Extremities: extremities normal, atraumatic, no cyanosis or edema Pulses: 2+ and symmetric Skin: Skin color, texture, turgor normal. No rashes or lesions Neurologic: Grossly normal  EKG sinus rhythm at 78 without ST or T wave changes.  Personally reviewed this EKG.  ASSESSMENT AND PLAN:   Hypertension History of essential hypertension blood pressure measured today 142/96 although at home it is in the 130/70 range.  He is on losartan and hydrochlorothiazide.  Hypertriglyceridemia History of hypertriglyceridemia with triglycerides over thousand normally now in the 600 range and on Vascepa.  He also was on rosuvastatin.  Obstructive sleep apnea History of nocturnal snoring, apnea and daytime somnolence.  We will check a home sleep study.  Chest pain of uncertain etiology Mr. Cacho was referred for chest pain.  This occurs several times a week lasting seconds to minutes at a time not brought on by any specific ascitic activity.  He does have positive risk factors.  I am going to get a coronary CTA to further evaluate     MD [facility] 06/29/2020 10:26 AM

## 2020-06-29 NOTE — Assessment & Plan Note (Signed)
History of essential hypertension blood pressure measured today 142/96 although at home it is in the 130/70 range.  He is on losartan and hydrochlorothiazide.

## 2020-06-29 NOTE — Patient Instructions (Addendum)
Medication Instructions:  Take Metoprolol 100 mg two hours before CT when scheduled.  *If you need a refill on your cardiac medications before your next appointment, please call your pharmacy*   Lab Work: BMET today   If you have labs (blood work) drawn today and your tests are completely normal, you will receive your results only by: MyChart Message (if you have MyChart) OR A paper copy in the mail If you have any lab test that is abnormal or we need to change your treatment, we will call you to review the results.   Testing/Procedures: Your physician has requested that you have cardiac CT. Cardiac computed tomography (CT) is a painless test that uses an x-ray machine to take clear, detailed pictures of your heart. For further information please visit [email or web address] Please follow instruction sheet as given.  Your physician has recommended that you have a sleep study. This test records several body functions during sleep, including: brain activity, eye movement, oxygen and carbon dioxide blood levels, heart rate and rhythm, breathing rate and rhythm, the flow of air through your mouth and nose, snoring, body muscle movements, and chest and belly movement.    Follow-Up: At [facility], you and your health needs are our priority.  As part of our continuing mission to provide you with exceptional heart care, we have created designated Provider Care Teams.  These Care Teams include your primary Cardiologist (physician) and Advanced Practice Providers (APPs -  Physician Assistants and Nurse Practitioners) who all work together to provide you with the care you need, when you need it.  We recommend signing up for the patient portal called "MyChart".  Sign up information is provided on this After Visit Summary.  MyChart is used to connect with patients for Virtual Visits (Telemedicine).  Patients are able to view lab/test results, encounter notes, upcoming appointments, etc.  Non-urgent  messages can be sent to your provider as well.   To learn more about what you can do with MyChart, go to [email or web address].    Your next appointment:   4-6 week(s)  The format for your next appointment:   In Person  Provider:   Nanetta Batty, MD   Other Instructions Your cardiac CT will be scheduled at one of the below locations:   [facility] [address] [place], [place] [address] [phone]  OR  [facility] [address] [place], [place] [address] [phone]  If scheduled at [facility], please arrive at the [facility] main entrance (entrance A) of [facility] 30 minutes prior to test start time. Proceed to the [facility] Radiology Department (first floor) to check-in and test prep.  If scheduled at [facility], please arrive 15 mins early for check-in and test prep.  Please follow these instructions carefully (unless otherwise directed):  Hold all erectile dysfunction medications at least 3 days (72 hrs) prior to test.  On the Night Before the Test: Be sure to Drink plenty of water. Do not consume any caffeinated/decaffeinated beverages or chocolate 12 hours prior to your test. Do not take any antihistamines 12 hours prior to your test.   On the Day of the Test: Drink plenty of water until 1 hour prior to the test. Do not eat any food 4 hours prior to the test. You may take your regular medications prior to the test.  Take metoprolol (Lopressor) two hours prior to test. HOLD Furosemide/Hydrochlorothiazide morning of the test. FEMALES-  please wear underwire-free bra if available  After the Test: Drink plenty of water. After receiving IV contrast, you may experience a mild flushed feeling. This is normal. On occasion, you may experience a mild rash up to 24 hours after the test. This is not dangerous. If this occurs, you can take Benadryl 25 mg  and increase your fluid intake. If you experience trouble breathing, this can be serious. If it is severe call 911 IMMEDIATELY. If it is mild, please call our office. If you take any of these medications: Glipizide/Metformin, Avandament, Glucavance, please do not take 48 hours after completing test unless otherwise instructed.   Once we have confirmed authorization from your insurance company, we will call you to set up a date and time for your test. Based on how quickly your insurance processes prior authorizations requests, please allow up to 4 weeks to be contacted for scheduling your Cardiac CT appointment. Be advised that routine Cardiac CT appointments could be scheduled as many as 8 weeks after your provider has ordered it.  For non-scheduling related questions, please contact the cardiac imaging nurse navigator should you have any questions/concerns: Rockwell Alexandria, Cardiac Imaging Nurse Navigator Larey Brick, Cardiac Imaging Nurse Navigator [facility] Direct Office Dial: [phone]   For scheduling needs, including cancellations and rescheduling, please call [place], [phone].

## 2020-06-29 NOTE — Assessment & Plan Note (Signed)
History of nocturnal snoring, apnea and daytime somnolence.  We will check a home sleep study.

## 2020-06-29 NOTE — Assessment & Plan Note (Signed)
Andrew Campbell was referred for chest pain.  This occurs several times a week lasting seconds to minutes at a time not brought on by any specific ascitic activity.  He does have positive risk factors.  I am going to get a coronary CTA to further evaluate

## 2020-06-30 ENCOUNTER — Other Ambulatory Visit: Payer: Self-pay

## 2020-07-07 ENCOUNTER — Telehealth (HOSPITAL_COMMUNITY): Payer: Self-pay | Admitting: *Deleted

## 2020-07-07 NOTE — Telephone Encounter (Signed)
Reaching out to patient to offer assistance regarding upcoming cardiac imaging study; pt verbalizes understanding of appt date/time, parking situation and where to check in, pre-test NPO status and medications ordered, and verified current allergies; name and call back number provided for further questions should they arise  Tylynn Braniff RN Navigator Cardiac Imaging [facility] [phone] office [phone] cell  Patient to take 100mg metoprolol tartrate 2 hours prior to cardiac CT scan. 

## 2020-07-12 ENCOUNTER — Other Ambulatory Visit: Payer: Self-pay

## 2020-07-12 ENCOUNTER — Ambulatory Visit (HOSPITAL_COMMUNITY)
Admission: RE | Admit: 2020-07-12 | Discharge: 2020-07-12 | Disposition: A | Discharge status: Home / Self Care | Payer: BC Managed Care – PPO | Source: Ambulatory Visit | Attending: Cardiovascular Disease | Admitting: Cardiovascular Disease

## 2020-07-12 ENCOUNTER — Ambulatory Visit (HOSPITAL_COMMUNITY)
Admission: RE | Admit: 2020-07-12 | Discharge: 2020-07-12 | Disposition: A | Discharge status: Home / Self Care | Payer: BC Managed Care – PPO | Source: Ambulatory Visit | Attending: Cardiology | Admitting: Cardiology

## 2020-07-12 ENCOUNTER — Other Ambulatory Visit: Payer: Self-pay | Admitting: Cardiology

## 2020-07-12 DIAGNOSIS — R931 Abnormal findings on diagnostic imaging of heart and coronary circulation: Secondary | ICD-10-CM | POA: Diagnosis not present

## 2020-07-12 DIAGNOSIS — R079 Chest pain, unspecified: Secondary | ICD-10-CM | POA: Diagnosis not present

## 2020-07-12 DIAGNOSIS — R072 Precordial pain: Secondary | ICD-10-CM | POA: Diagnosis not present

## 2020-07-12 DIAGNOSIS — I2510 Atherosclerotic heart disease of native coronary artery without angina pectoris: Secondary | ICD-10-CM | POA: Diagnosis not present

## 2020-07-12 MED ORDER — NITROGLYCERIN 0.4 MG SL SUBL
SUBLINGUAL_TABLET | SUBLINGUAL | Status: AC
  Filled 2020-07-12: qty 2

## 2020-07-12 MED ORDER — IOHEXOL 350 MG/ML SOLN
95.0000 mL | Freq: Once | INTRAVENOUS | Status: AC | PRN
  Administered 2020-07-12: 95 mL via INTRAVENOUS

## 2020-07-12 MED ORDER — NITROGLYCERIN 0.4 MG SL SUBL
0.8000 mg | SUBLINGUAL_TABLET | Freq: Once | SUBLINGUAL | Status: AC
  Administered 2020-07-12: 0.8 mg via SUBLINGUAL

## 2020-07-15 ENCOUNTER — Telehealth: Payer: Self-pay | Admitting: *Deleted

## 2020-07-15 NOTE — Telephone Encounter (Signed)
My Chart message sent to patient with sleep study appointment details. Unable to reach patient by phone. VM not set up.

## 2020-07-22 ENCOUNTER — Encounter: Payer: BC Managed Care – PPO | Admitting: Cardiovascular Disease

## 2020-08-16 ENCOUNTER — Ambulatory Visit: Payer: BC Managed Care – PPO | Admitting: Cardiovascular Disease

## 2020-08-17 ENCOUNTER — Telehealth: Payer: Self-pay | Admitting: *Deleted

## 2020-08-17 NOTE — Telephone Encounter (Signed)
Routed request for cardiac clearance through Epic.

## 2020-08-17 NOTE — Telephone Encounter (Signed)
Patient scheduled for procedure 8/18 with Dr. Jena Gauss. Fowarding to Angie/Courtney to get cardiac clearance.

## 2020-08-17 NOTE — Telephone Encounter (Signed)
   Primary Cardiologist: Nanetta Batty, MD  Chart reviewed as part of pre-operative protocol coverage. Given past medical history and time since last visit, based on ACC/AHA guidelines, Andrew Campbell would be at acceptable risk for the planned procedure without further cardiovascular testing.   I will route this recommendation to the requesting party via Epic fax function and remove from pre-op pool.  Please call with questions.  Thomasene Ripple. Jaeden Westbay NP-C    08/17/2020, 10:49 AM [facility] [address] Office [phone] Fax [phone]

## 2020-08-17 NOTE — Telephone Encounter (Signed)
-----   Message from Lillia Mountain, RN sent at 08/17/2020  9:38 AM EDT ----- Regarding: Patient is being seen by cardiology for chest pain Andrew Campbell,  Cardiology saw [name] on 06/29/20 for chest pain.  There is a note about future testing of his heart.    He will need cardiac clearance before his procedure.   Thanks,  Cliffton Asters RN

## 2020-08-17 NOTE — Telephone Encounter (Signed)
Attention: Preop   We would like obtain cardiac clearance for this patient please.    Procedure: Colonoscopy and EGD +/- dilation  Date: 08/25/2020  Surgeon: Dr. Jena Gauss  Phone: [phone]  Fax:  [phone]  Type of Anesthesia: Propofol (ASA III)

## 2020-08-17 NOTE — Patient Instructions (Signed)
Your procedure is scheduled on: 08/25/2020  [facility] at     7:00 AM.  Call this number if you have problems the morning of surgery: [phone]   Remember:              Follow Directions on the letter you received from Your Physician's office regarding the Bowel Prep              No Smoking the day of Procedure :   Take these medicines the morning of surgery with A SIP OF WATER: Pristiq use inhalers and/or  xanax if needed             Take only 1/2 dose of Basaglar insulin 12 units at bedtime the night before procedure.              No diabetic medication am of procedure   Do not wear jewelry, make-up or nail polish.    Do not bring valuables to the hospital.  Contacts, dentures or bridgework may not be worn into surgery.  .   Patients discharged the day of surgery will not be allowed to drive home.     Colonoscopy, Adult, Care After This sheet gives you information about how to care for yourself after your procedure. Your health care provider may also give you more specific instructions. If you have problems or questions, contact your health care provider. What can I expect after the procedure? After the procedure, it is common to have: A small amount of blood in your stool for 24 hours after the procedure. Some gas. Mild abdominal cramping or bloating.  Follow these instructions at home: General instructions  For the first 24 hours after the procedure: Do not drive or use machinery. Do not sign important documents. Do not drink alcohol. Do your regular daily activities at a slower pace than normal. Eat soft, easy-to-digest foods. Rest often. Take over-the-counter or prescription medicines only as told by your health care provider. It is up to you to get the results of your procedure. Ask your health care provider, or the department performing the procedure, when your results will be ready. Relieving cramping and bloating Try walking around when you have  cramps or feel bloated. Apply heat to your abdomen as told by your health care provider. Use a heat source that your health care provider recommends, such as a moist heat pack or a heating pad. Place a towel between your skin and the heat source. Leave the heat on for 20-30 minutes. Remove the heat if your skin turns bright red. This is especially important if you are unable to feel pain, heat, or cold. You may have a greater risk of getting burned. Eating and drinking Drink enough fluid to keep your urine clear or pale yellow. Resume your normal diet as instructed by your health care provider. Avoid heavy or fried foods that are hard to digest. Avoid drinking alcohol for as long as instructed by your health care provider. Contact a health care provider if: You have blood in your stool 2-3 days after the procedure. Get help right away if: You have more than a small spotting of blood in your stool. You pass large blood clots in your stool. Your abdomen is swollen. You have nausea or vomiting. You have a fever. You have increasing abdominal pain that is not relieved with medicine. This information is not intended to replace advice given to you by your health care provider. Make sure you discuss  any questions you have with your health care provider. Document Released: 08/09/2003 Document Revised: 09/19/2015 Document Reviewed: 03/08/2015 Elsevier Interactive Patient Education  2018 Elsevier Inc.  Upper Endoscopy, Adult, Care After This sheet gives you information about how to care for yourself after your procedure. Your health care provider may also give you more specific instructions. If you have problems or questions, contact your health careprovider. What can I expect after the procedure? After the procedure, it is common to have: A sore throat. Mild stomach pain or discomfort. Bloating. Nausea. Follow these instructions at home:  Follow instructions from your health care provider  about what to eat or drink after your procedure. Return to your normal activities as told by your health care provider. Ask your health care provider what activities are safe for you. Take over-the-counter and prescription medicines only as told by your health care provider. If you were given a sedative during the procedure, it can affect you for several hours. Do not drive or operate machinery until your health care provider says that it is safe. Keep all follow-up visits as told by your health care provider. This is important. Contact a health care provider if you have: A sore throat that lasts longer than one day. Trouble swallowing. Get help right away if: You vomit blood or your vomit looks like coffee grounds. You have: A fever. Bloody, black, or tarry stools. A severe sore throat or you cannot swallow. Difficulty breathing. Severe pain in your chest or abdomen. Summary After the procedure, it is common to have a sore throat, mild stomach discomfort, bloating, and nausea. If you were given a sedative during the procedure, it can affect you for several hours. Do not drive or operate machinery until your health care provider says that it is safe. Follow instructions from your health care provider about what to eat or drink after your procedure. Return to your normal activities as told by your health care provider. This information is not intended to replace advice given to you by your health care provider. Make sure you discuss any questions you have with your healthcare provider. Document Revised: 12/23/2018 Document Reviewed: 05/27/2017 Elsevier Patient Education  2022 Elsevier Inc. https://www.asge.org/home/for-patients/patient-information/understanding-eso-dilation-updated">  Esophageal Dilatation Esophageal dilatation, also called esophageal dilation, is a procedure to widen or open a blocked or narrowed part of the esophagus. The esophagus is the part of the body that moves food  and liquid from the mouth to the stomach. You may need this procedure if: You have a buildup of scar tissue in your esophagus that makes it difficult, painful, or impossible to swallow. This can be caused by gastroesophageal reflux disease (GERD). You have cancer of the esophagus. There is a problem with how food moves through your esophagus. In some cases, you may need this procedure repeated at a later time to dilatethe esophagus gradually. Tell a health care provider about: Any allergies you have. All medicines you are taking, including vitamins, herbs, eye drops, creams, and over-the-counter medicines. Any problems you or family members have had with anesthetic medicines. Any blood disorders you have. Any surgeries you have had. Any medical conditions you have. Any antibiotic medicines you are required to take before dental procedures. Whether you are pregnant or may be pregnant. What are the risks? Generally, this is a safe procedure. However, problems may occur, including: Bleeding due to a tear in the lining of the esophagus. A hole, or perforation, in the esophagus. What happens before the procedure? Ask your health care  provider about: Changing or stopping your regular medicines. This is especially important if you are taking diabetes medicines or blood thinners. Taking medicines such as aspirin and ibuprofen. These medicines can thin your blood. Do not take these medicines unless your health care provider tells you to take them. Taking over-the-counter medicines, vitamins, herbs, and supplements. Follow instructions from your health care provider about eating or drinking restrictions. Plan to have a responsible adult take you home from the hospital or clinic. Plan to have a responsible adult care for you for the time you are told after you leave the hospital or clinic. This is important. What happens during the procedure? You may be given a medicine to help you relax  (sedative). A numbing medicine may be sprayed into the back of your throat, or you may gargle the medicine. Your health care provider may perform the dilatation using various surgical instruments, such as: Simple dilators. This instrument is carefully placed in the esophagus to stretch it. Guided wire bougies. This involves using an endoscope to insert a wire into the esophagus. A dilator is passed over this wire to enlarge the esophagus. Then the wire is removed. Balloon dilators. An endoscope with a small balloon is inserted into the esophagus. The balloon is inflated to stretch the esophagus and open it up. The procedure may vary among health care providers and hospitals. What can I expect after the procedure? Your blood pressure, heart rate, breathing rate, and blood oxygen level will be monitored until you leave the hospital or clinic. Your throat may feel slightly sore and numb. This will get better over time. You will not be allowed to eat or drink until your throat is no longer numb. When you are able to drink, urinate, and sit on the edge of the bed without nausea or dizziness, you may be able to return home. Follow these instructions at home: Take over-the-counter and prescription medicines only as told by your health care provider. If you were given a sedative during the procedure, it can affect you for several hours. Do not drive or operate machinery until your health care provider says that it is safe. Plan to have a responsible adult care for you for the time you are told. This is important. Follow instructions from your health care provider about any eating or drinking restrictions. Do not use any products that contain nicotine or tobacco, such as cigarettes, e-cigarettes, and chewing tobacco. If you need help quitting, ask your health care provider. Keep all follow-up visits. This is important. Contact a health care provider if: You have a fever. You have pain that is not  relieved by medicine. Get help right away if: You have chest pain. You have trouble breathing. You have trouble swallowing. You vomit blood. You have black, tarry, or bloody stools. These symptoms may represent a serious problem that is an emergency. Do not wait to see if the symptoms will go away. Get medical help right away. Call your local emergency services (911 in the [place] Do not drive yourself to the hospital. Summary Esophageal dilatation, also called esophageal dilation, is a procedure to widen or open a blocked or narrowed part of the esophagus. Plan to have a responsible adult take you home from the hospital or clinic. For this procedure, a numbing medicine may be sprayed into the back of your throat, or you may gargle the medicine. Do not drive or operate machinery until your health care provider says that it is safe. This information is  not intended to replace advice given to you by your health care provider. Make sure you discuss any questions you have with your healthcare provider. Document Revised: 05/13/2019 Document Reviewed: 05/13/2019 Elsevier Patient Education  2022 [employer].

## 2020-08-18 NOTE — Telephone Encounter (Signed)
Received clearance.  See other note.

## 2020-08-18 NOTE — Telephone Encounter (Signed)
Cardiology has cleared patient for procedure. See documentation below.

## 2020-08-22 ENCOUNTER — Telehealth: Payer: Self-pay | Admitting: Internal Medicine

## 2020-08-22 NOTE — Telephone Encounter (Signed)
Pt called office, informed him cardiac clearance was received. He will keep procedure as scheduled.

## 2020-08-22 NOTE — Telephone Encounter (Signed)
Pt is scheduled with Dr Jena Gauss on 8/18 and is calling to see if we received his clearance from his cardiac doctor. Then he is saying he may need to reschedule his procedure. Please call [phone]

## 2020-08-22 NOTE — Telephone Encounter (Signed)
Cardiac clearance has been received.  Tried to call pt, LMOVM for return call.

## 2020-08-23 ENCOUNTER — Telehealth: Payer: Self-pay | Admitting: Internal Medicine

## 2020-08-23 ENCOUNTER — Encounter: Payer: Self-pay | Admitting: Internal Medicine

## 2020-08-23 ENCOUNTER — Encounter (HOSPITAL_COMMUNITY)
Admission: RE | Admit: 2020-08-23 | Discharge: 2020-08-23 | Disposition: A | Discharge status: Home / Self Care | Payer: BC Managed Care – PPO | Source: Ambulatory Visit | Attending: Internal Medicine | Admitting: Internal Medicine

## 2020-08-23 ENCOUNTER — Telehealth: Payer: Self-pay | Admitting: *Deleted

## 2020-08-23 ENCOUNTER — Encounter (HOSPITAL_COMMUNITY): Payer: Self-pay | Admitting: Anesthesiology

## 2020-08-23 ENCOUNTER — Other Ambulatory Visit: Payer: Self-pay

## 2020-08-23 ENCOUNTER — Encounter (HOSPITAL_COMMUNITY): Payer: Self-pay

## 2020-08-23 DIAGNOSIS — Z01812 Encounter for preprocedural laboratory examination: Secondary | ICD-10-CM | POA: Diagnosis not present

## 2020-08-23 LAB — GLUCOSE, CAPILLARY: Glucose-Capillary: 329 mg/dL — ABNORMAL HIGH (ref 70–99)

## 2020-08-23 NOTE — Pre-Procedure Instructions (Signed)
Glucose levels Received: Today Elsie Amis, RN  Rourk, Gerrit Friends, MD Cc: Armstead Peaks, CMA    Elsie Amis, RN  Registered Nurse  Surgery  Pre-Procedure Instructions    Signed  Date of Service:  08/23/2020  8:00 AM       Signed        In for PAT. While questioning him about his diabetes, he admits that he has not taken any of his diabetic meds for about 1 week. When asked why, no real response was given. Did CBG and his glucose was 329. Talked at great length to patient about diabetes and importance of taking his medications and keeping a check on his glucose level. We talked about the long  and short term effects on the rest of his body systems by not taking care of this. Also instructed him that if his glucose levels were this high, his procedure would be canceled. He stated he understood the importance of this, "but Im just not doing it". He stated he would go home and eat and take his medications as he was supposed to starting now. He was instructed on the diabetic classes that our dieticians provide and I also talked with him about contacting his PCP for any questions. He verbalized understanding of this.    Good morning Dr Jena Gauss. I just saw Mr Blankenburg for his PAT. Dr Alva Garnet wants Jusitn to be on his diabetic meds for at least 1 week before we do his procedure. As you can see from my note, he has been noncompliant. I did not see a recent hgba1c, but Mr Reha did say his glucose has been as high as 600 recently!   Mindy, let me know if I need to call him or if your guys will, depending on Dr Rourk's further recommendations. Thank you!   Electronically

## 2020-08-23 NOTE — Telephone Encounter (Signed)
-----   Message from Corbin Ade, MD sent at 08/23/2020  9:34 AM EDT ----- Regarding: RE: Glucose levels Yes, he needs to be tuned up.  Lets hold off on procedures for now.  I plan just to see him back in 6 to 8 weeks in the office (after he is got a hold of his blood sugars) reassess/reschedule. ----- Message ----- From: Elsie Amis, RN Sent: 08/23/2020   9:26 AM EDT To: Tasheena Wambolt Gillermina Phy, CMA, Corbin Ade, MD Subject: Glucose levels                                   Elsie Amis, RN Registered Nurse Surgery Pre-Procedure Instructions   Signed Date of Service:  08/23/2020 8:00 AM      Signed        In for PAT. While questioning him about his diabetes, he admits that he has not taken any of his diabetic meds for about 1 week. When asked why, no real response was given. Did CBG and his glucose was 329. Talked at great length to patient about diabetes and importance of taking his medications and keeping a check on his glucose level. We talked about the long  and short term effects on the rest of his body systems by not taking care of this. Also instructed him that if his glucose levels were this high, his procedure would be canceled. He stated he understood the importance of this, "but Im just not doing it". He stated he would go home and eat and take his medications as he was supposed to starting now. He was instructed on the diabetic classes that our dieticians provide and I also talked with him about contacting his PCP for any questions. He verbalized understanding of this.   Good morning Dr Jena Gauss. I just saw Mr Molyneux for his PAT. Dr Alva Garnet wants Tycho to be on his diabetic meds for at least 1 week before we do his procedure. As you can see from my note, he has been noncompliant. I did not see a recent hgba1c, but Mr Patras did say his glucose has been as high as 600 recently!   Christina Gintz, let me know if I need to call him or if your guys will, depending on Dr Rourk's  further recommendations. Thank you!  Electronically

## 2020-08-23 NOTE — Telephone Encounter (Signed)
Pt is needing an appt with Dr. Gala Romney in 6 to 8 weeks to reassess/reschedule. Pt has met his out of pocket and is wanting to get his colonoscopy done this year due to this.

## 2020-08-23 NOTE — Pre-Procedure Instructions (Signed)
Message Received: Today Andrew Campbell, Andrew Campbell, CMA  Elsie Amis, RN I tried calling pt, no answer and no VM. I called Eber Jones and LM to cancel procedure.

## 2020-08-23 NOTE — Telephone Encounter (Signed)
Called pt. No answer and VM not set up yet. Procedure has been cancelled. Stacey please schedule OV per Dr. Jena Gauss below. Thanks!

## 2020-08-23 NOTE — Telephone Encounter (Signed)
Zada Finders  08/23/2020  2:06 PM EDT     Called pt and informed him of colonoscopy being cancelled due to glucose level. Pt verbalized understanding. Forwarded message to clinical pool to reschedule colonoscopy.

## 2020-08-23 NOTE — Pre-Procedure Instructions (Signed)
RE: Glucose levels Received: Today Campbell, Andrew Campbell Friends, MD  Elsie Amis, RN Cc: Armstead Peaks, CMA Yes, he needs to be tuned up.  Lets hold off on procedures for now.  I plan just to see him back in 6 to 8 weeks in the office (after he is got a hold of his blood sugars) reassess/reschedule.        Previous Messages   ----- Message -----  From: Elsie Amis, RN  Sent: 08/23/2020   9:26 AM EDT  To: Andrew Campbell Campbell, CMA, Andrew Campbell Ade, MD  Subject: Glucose levels                                    Elsie Amis, RN  Registered Nurse  Surgery  Pre-Procedure Instructions    Signed  Date of Service:  08/23/2020  8:00 AM       Signed        In for PAT. While questioning him about his diabetes, he admits that he has not taken any of his diabetic meds for about 1 week. When asked why, no real response was given. Did CBG and his glucose was 329. Talked at great length to patient about diabetes and importance of taking his medications and keeping a check on his glucose level. We talked about the long  and short term effects on the rest of his body systems by not taking care of this. Also instructed him that if his glucose levels were this high, his procedure would be canceled. He stated he understood the importance of this, "but Im just not doing it". He stated he would go home and eat and take his medications as he was supposed to starting now. He was instructed on the diabetic classes that our dieticians provide and I also talked with him about contacting his PCP for any questions. He verbalized understanding of this.    Good morning Dr Andrew Campbell Campbell. I just saw Andrew Campbell Campbell for his PAT. Dr Andrew Campbell Campbell wants Andrew Campbell to be on his diabetic meds for at least 1 week before we do his procedure. As you can see from my note, he has been noncompliant. I did not see a recent hgba1c, but Andrew Campbell Campbell did say his glucose has been as high as 600 recently!   Andrew, let me know if I need to call him or if  your guys will, depending on Dr Campbell's further recommendations. Thank you!   Electronically

## 2020-08-23 NOTE — Pre-Procedure Instructions (Signed)
In for PAT. While questioning him about his diabetes, he admits that he has not taken any of his diabetic meds for about 1 week. When asked why, no real response was given. Did CBG and his glucose was 329. Talked at great length to patient about diabetes and importance of taking his medications and keeping a check on his glucose level. We talked about the long  and short term effects on the rest of his body systems by not taking care of this. Also instructed him that if his glucose levels were this high, his procedure would be canceled. He stated he understood the importance of this, "but Im just not doing it". He stated he would go home and eat and take his medications as he was supposed to starting now. He was instructed on the diabetic classes that our dieticians provide and I also talked with him about contacting his PCP for any questions. He verbalized understanding of this.

## 2020-08-25 ENCOUNTER — Encounter (HOSPITAL_COMMUNITY): Admission: RE | Payer: Self-pay | Source: Home / Self Care

## 2020-08-25 ENCOUNTER — Ambulatory Visit (HOSPITAL_COMMUNITY)
Admission: RE | Admit: 2020-08-25 | Payer: BC Managed Care – PPO | Source: Home / Self Care | Admitting: Internal Medicine

## 2020-08-25 SURGERY — COLONOSCOPY WITH PROPOFOL
Anesthesia: Monitor Anesthesia Care

## 2020-09-16 ENCOUNTER — Telehealth: Payer: BC Managed Care – PPO | Admitting: Internal Medicine

## 2020-09-16 DIAGNOSIS — E1165 Type 2 diabetes mellitus with hyperglycemia: Secondary | ICD-10-CM | POA: Diagnosis not present

## 2020-09-16 DIAGNOSIS — I10 Essential (primary) hypertension: Secondary | ICD-10-CM | POA: Diagnosis not present

## 2020-09-20 DIAGNOSIS — E782 Mixed hyperlipidemia: Secondary | ICD-10-CM | POA: Diagnosis not present

## 2020-09-20 DIAGNOSIS — E1165 Type 2 diabetes mellitus with hyperglycemia: Secondary | ICD-10-CM | POA: Diagnosis not present

## 2020-09-20 DIAGNOSIS — I10 Essential (primary) hypertension: Secondary | ICD-10-CM | POA: Diagnosis not present

## 2020-09-20 DIAGNOSIS — E669 Obesity, unspecified: Secondary | ICD-10-CM | POA: Diagnosis not present

## 2020-10-06 ENCOUNTER — Encounter: Payer: Self-pay | Admitting: Internal Medicine

## 2020-12-20 ENCOUNTER — Telehealth: Payer: Self-pay

## 2020-12-20 NOTE — Telephone Encounter (Signed)
Letter has been sent to patient informing them that their sleep study has expired. Patient will need to call and schedule an office visit to re-evaluate the need for a sleep study.    

## 2020-12-21 ENCOUNTER — Ambulatory Visit: Payer: BC Managed Care – PPO | Admitting: Gastroenterology

## 2021-01-18 ENCOUNTER — Encounter: Payer: Self-pay | Admitting: Gastroenterology

## 2021-04-19 ENCOUNTER — Ambulatory Visit: Payer: Self-pay | Admitting: Gastroenterology

## 2021-05-11 ENCOUNTER — Ambulatory Visit: Payer: Self-pay | Admitting: Gastroenterology

## 2022-01-11 DIAGNOSIS — E559 Vitamin D deficiency, unspecified: Secondary | ICD-10-CM | POA: Diagnosis not present

## 2022-01-11 DIAGNOSIS — I10 Essential (primary) hypertension: Secondary | ICD-10-CM | POA: Diagnosis not present

## 2022-01-11 DIAGNOSIS — E1165 Type 2 diabetes mellitus with hyperglycemia: Secondary | ICD-10-CM | POA: Diagnosis not present

## 2022-01-11 DIAGNOSIS — E782 Mixed hyperlipidemia: Secondary | ICD-10-CM | POA: Diagnosis not present

## 2022-01-17 DIAGNOSIS — E118 Type 2 diabetes mellitus with unspecified complications: Secondary | ICD-10-CM | POA: Diagnosis not present

## 2022-01-17 DIAGNOSIS — E1165 Type 2 diabetes mellitus with hyperglycemia: Secondary | ICD-10-CM | POA: Diagnosis not present

## 2022-01-17 DIAGNOSIS — F411 Generalized anxiety disorder: Secondary | ICD-10-CM | POA: Diagnosis not present

## 2022-01-17 DIAGNOSIS — I10 Essential (primary) hypertension: Secondary | ICD-10-CM | POA: Diagnosis not present

## 2022-01-17 DIAGNOSIS — Z91141 Patient's other noncompliance with medication regimen due to financial hardship: Secondary | ICD-10-CM | POA: Diagnosis not present

## 2022-01-17 DIAGNOSIS — E782 Mixed hyperlipidemia: Secondary | ICD-10-CM | POA: Diagnosis not present

## 2022-01-21 IMAGING — DX DG ABDOMEN 1V
2 series · 2 of 2 positions shown · non-contrast
Comparison: CT abdomen pelvis dated January 13, 2004.

CLINICAL DATA: Intermittent abdominal pain for the past 6 weeks.
History of kidney stones.

EXAM:
ABDOMEN - 1 VIEW

[abdomen kub (1 of 2)]
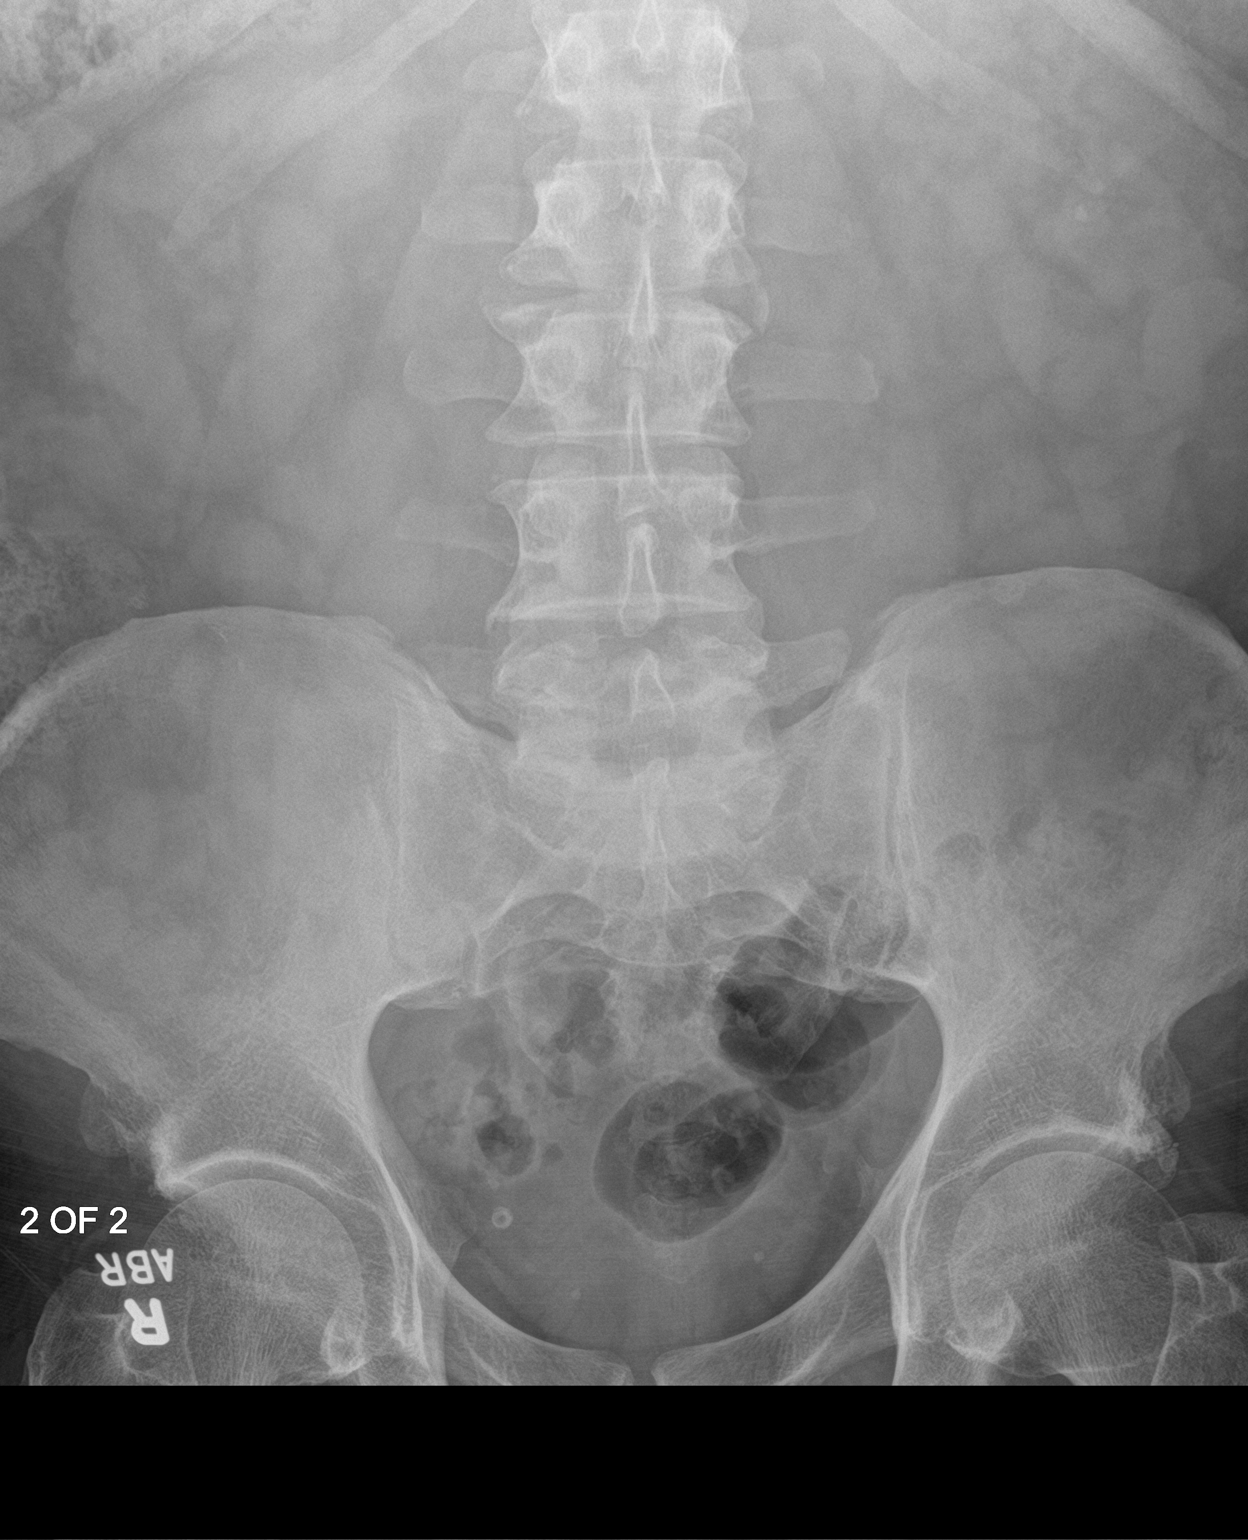

[abdomen kub (2 of 2)]
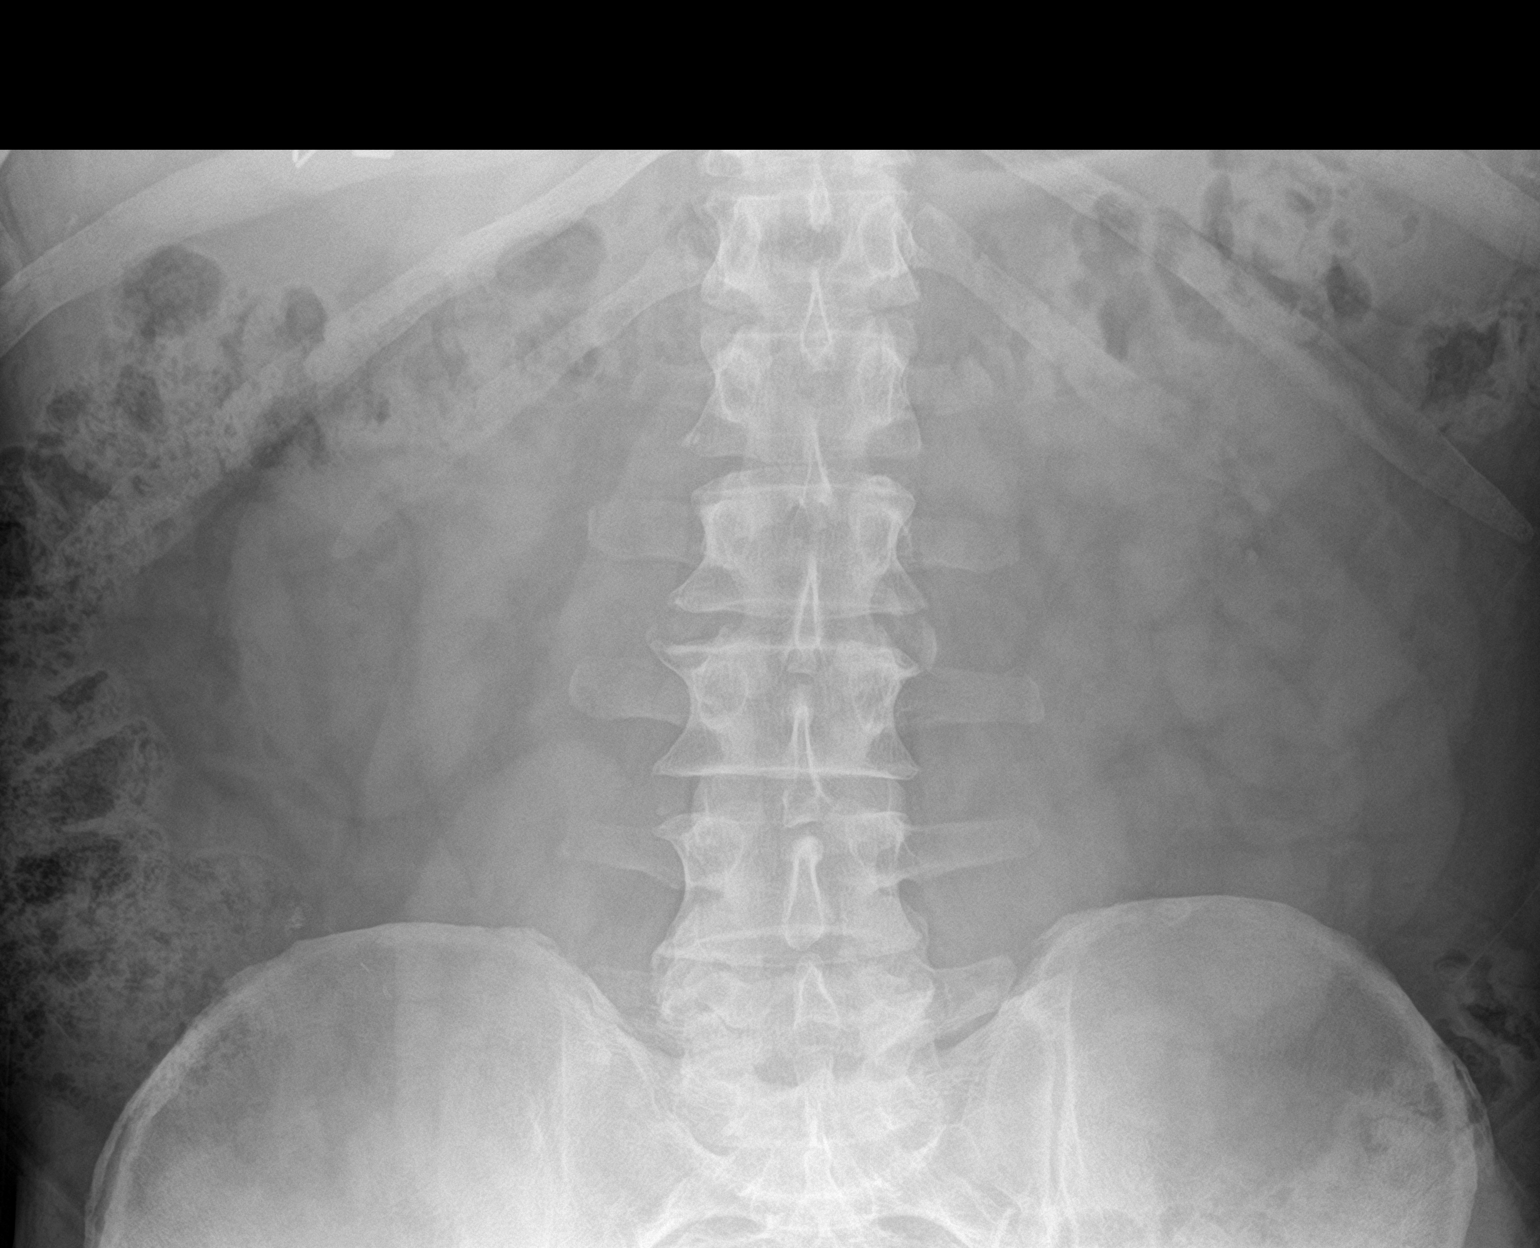

[2 of 2 positions shown; findings below may reference images not displayed]

FINDINGS: Small left renal calculus. Phleboliths in the pelvis. Normal bowel
gas pattern. Prior cholecystectomy. No acute osseous abnormality.
IMPRESSION: 1. Left nephrolithiasis.

## 2022-02-14 ENCOUNTER — Telehealth (INDEPENDENT_AMBULATORY_CARE_PROVIDER_SITE_OTHER): Payer: Self-pay | Admitting: *Deleted

## 2022-02-14 ENCOUNTER — Ambulatory Visit (INDEPENDENT_AMBULATORY_CARE_PROVIDER_SITE_OTHER): Payer: Self-pay | Admitting: Gastroenterology

## 2022-02-14 ENCOUNTER — Encounter: Payer: Self-pay | Admitting: Gastroenterology

## 2022-02-14 VITALS — BP 132/88 | HR 80 | Temp 98.7°F | Ht 74.0 in | Wt 225.2 lb

## 2022-02-14 DIAGNOSIS — R1310 Dysphagia, unspecified: Secondary | ICD-10-CM

## 2022-02-14 DIAGNOSIS — Z1211 Encounter for screening for malignant neoplasm of colon: Secondary | ICD-10-CM

## 2022-02-14 DIAGNOSIS — R195 Other fecal abnormalities: Secondary | ICD-10-CM

## 2022-02-14 MED ORDER — DICYCLOMINE HCL 10 MG PO CAPS: 10.0000 mg | ORAL_CAPSULE | Freq: Every day | ORAL | 3 refills | Status: AC | PRN

## 2022-02-14 NOTE — Patient Instructions (Signed)
We are arranging a colonoscopy, upper endoscopy, and dilation by Dr. Gala Romney in the near future!  Please do not take Janumet the day of the procedure.  I refilled dicyclomine to take as needed!  It was a pleasure to see you today. I want to create trusting relationships with patients and provide genuine, compassionate, and quality care. I truly value your feedback, so please be on the lookout for a survey regarding your visit with me today. I appreciate your time in completing this!    Annitta Needs, PhD, ANP-BC [facility]

## 2022-02-14 NOTE — Telephone Encounter (Signed)
Called pt, no answer and VM not set up. Need to schedule TCS/EGD/DIL with Dr. Gala Romney ASA 2. See encounter form for full details

## 2022-02-14 NOTE — H&P (View-Only) (Signed)
Gastroenterology Office Note    Referring Provider: Celene Squibb, MD Primary Care Physician:  Celene Squibb, MD  Primary GI: Dr. Gala Romney    Chief Complaint   Chief Complaint  Patient presents with   Need for Tcs    Patient here today due for a Tcs due to age. He says he has issues with mid abdominal pain, he has issues with loose stools at times. He has occasional nausea. He does take Bentyl prn.     History of Present Illness   Andrew Campbell is a 52 y.o. male presenting today to arrange routine screening colonoscopy and EGD for dysphagia. He was last seen in 2022. He is unsure, but thinks he may have had colon polyps in his 106s.   Notes history of IBS. Intermittent loose stool depending on food choices. Occasional mid abdominal cramping associated. Taking dicyclomine sparingly. Feels improved from the past.   Some days no BM, sometimes 3-4 times per day. Pasta sauce, chinese food will loosen stools. No rectal bleeding. Stomach growling.  Remote esophageal dilation in the past. Still with intermittent dysphagia at times. No issues with GERD currently. Rare dicyclomine. Out of date. Nausea moreso related to not eating all day at work. Stricter with blood glucose over the past month.     Past Medical History:  Diagnosis Date   Anxiety    Depression    Diabetes mellitus without complication ([place])    GERD (gastroesophageal reflux disease)    H/O hiatal hernia    Hypercholesterolemia    Hypertension     Past Surgical History:  Procedure Laterality Date   ANAL FISTULECTOMY  06/18/2011   Procedure: FISTULECTOMY ANAL;  Surgeon: Donato Heinz, MD;  Location: [facility];  Service: General;  Laterality: Right;   APPENDECTOMY  1997   Dr Clydie Braun   CHOLECYSTECTOMY     Dr Clydie Braun   HERNIA REPAIR     left-[place]    Current Outpatient Medications  Medication Sig Dispense Refill   ALPRAZolam (XANAX) 0.5 MG tablet Take 0.25 mg by mouth daily as needed for anxiety.      desvenlafaxine (PRISTIQ) 50 MG 24 hr tablet Take 50 mg by mouth daily.     dicyclomine (BENTYL) 10 MG capsule Take 1 capsule (10 mg total) by mouth 4 (four) times daily -  before meals and at bedtime. (Patient taking differently: Take 10 mg by mouth daily as needed for spasms.) 120 capsule 3   ergocalciferol (VITAMIN D2) 1.25 MG (50000 UT) capsule Take 50,000 Units by mouth once a week.     levocetirizine (XYZAL) 5 MG tablet Take one tablet daily as needed. 30 tablet 2   losartan-hydrochlorothiazide (HYZAAR) 100-25 MG tablet Take 1 tablet by mouth daily.     oxymetazoline (AFRIN) 0.05 % nasal spray Place 1 spray into both nostrils 2 (two) times daily as needed for congestion.     rosuvastatin (CRESTOR) 5 MG tablet Take 5 mg by mouth daily.     SitaGLIPtin-MetFORMIN HCl (JANUMET XR) 50-1000 MG TB24 Take 1 tablet by mouth daily.     No current facility-administered medications for this visit.    Allergies as of 02/14/2022   (No Known Allergies)    Family History  Problem Relation Age of Onset   Hypertension Father    COPD Father    Lung cancer Father    Hypertension Sister    Hypertension Mother    Heart disease Mother    Asthma Mother  Colon cancer Paternal Grandmother        diagnosed in his 75s.    Allergic rhinitis Neg Hx    Angioedema Neg Hx    Eczema Neg Hx    Immunodeficiency Neg Hx    Urticaria Neg Hx    Inflammatory bowel disease Neg Hx     Social History   Socioeconomic History   Marital status: Married    Spouse name: Not on file   Number of children: 2   Years of education: Not on file   Highest education level: Not on file  Occupational History   Not on file  Tobacco Use   Smoking status: Former    Packs/day: 1.00    Years: 3.00    Total pack years: 3.00    Types: Cigarettes    Quit date: 06/12/2005    Years since quitting: 16.6   Smokeless tobacco: Current    Types: Chew  Vaping Use   Vaping Use: Never used  Substance and Sexual Activity    Alcohol use: Yes    Comment: rarely   Drug use: Not Currently    Types: Marijuana    Comment: About 20-30 years ago.    Sexual activity: Yes    Birth control/protection: None  Other Topics Concern   Not on file  Social History Narrative   Not on file   Social Determinants of Health   Financial Resource Strain: Not on file  Food Insecurity: Not on file  Transportation Needs: Not on file  Physical Activity: Not on file  Stress: Not on file  Social Connections: Not on file  Intimate Partner Violence: Not on file     Review of Systems   Gen: Denies any fever, chills, fatigue, weight loss, lack of appetite.  CV: Denies chest pain, heart palpitations, peripheral edema, syncope.  Resp: Denies shortness of breath at rest or with exertion. Denies wheezing or cough.  GI: see HPI GU : Denies urinary burning, urinary frequency, urinary hesitancy MS: Denies joint pain, muscle weakness, cramps, or limitation of movement.  Derm: Denies rash, itching, dry skin Psych: Denies depression, anxiety, memory loss, and confusion Heme: Denies bruising, bleeding, and enlarged lymph nodes.   Physical Exam   BP 132/88 (BP Location: Left Arm, Patient Position: Sitting, Cuff Size: Large)   Pulse 80   Temp 98.7 F (37.1 C) (Temporal)   Ht '6\' 2"'$  (1.88 m)   Wt 225 lb 3.2 oz (102.2 kg)   BMI 28.91 kg/m  General:   Alert and oriented. Pleasant and cooperative. Well-nourished and well-developed.  Head:  Normocephalic and atraumatic. Eyes:  Without icterus Ears:  Normal auditory acuity. Lungs:  Clear to auscultation bilaterally.  Heart:  S1, S2 present without murmurs appreciated.  Abdomen:  +BS, soft, non-tender and non-distended. No HSM noted. No guarding or rebound. No masses appreciated.  Rectal:  Deferred  Msk:  Symmetrical without gross deformities. Normal posture. Extremities:  Without edema. Neurologic:  Alert and  oriented x4;  grossly normal neurologically. Skin:  Intact without  significant lesions or rashes. Psych:  Alert and cooperative. Normal mood and affect.   Assessment   Andrew Campbell is a 52 y.o. male presenting today  to arrange routine colonoscopy and EGD for dysphagia. He was last seen in 2022. He is unsure, but thinks he may have had colon polyps in his 10s. No first degree relatives with history of colon cancer.  Long-standing history of IBS with looser stools depending on dietary choices and  well-managed with rare dicyclomine. No alarm signs/symptoms.   Dysphagia: notes dilation in the distant past with good results, approximately in his 38s. No GERD symptoms. Pursue EGD/dilation in the near future.      PLAN   Proceed with colonoscopy/EGD/dilation by Dr. Gala Romney in near future: the risks, benefits, and alternatives have been discussed with the patient in detail. The patient states understanding and desires to proceed.  Refills on dicyclomine provided  Further recommendations to follow    Annitta Needs, PhD, ANP-BC [facility]

## 2022-02-14 NOTE — Progress Notes (Signed)
  Gastroenterology Office Note    Referring Provider: Hall, John Z, MD Primary Care Physician:  Hall, John Z, MD  Primary GI: Dr. Rourk    Chief Complaint   Chief Complaint  Patient presents with   Need for Tcs    Patient here today due for a Tcs due to age. He says he has issues with mid abdominal pain, he has issues with loose stools at times. He has occasional nausea. He does take Bentyl prn.     History of Present Illness   Andrew Campbell is a 52 y.o. male presenting today to arrange routine screening colonoscopy and EGD for dysphagia. He was last seen in 2022. He is unsure, but thinks he may have had colon polyps in his 20s.   Notes history of IBS. Intermittent loose stool depending on food choices. Occasional mid abdominal cramping associated. Taking dicyclomine sparingly. Feels improved from the past.   Some days no BM, sometimes 3-4 times per day. Pasta sauce, chinese food will loosen stools. No rectal bleeding. Stomach growling.  Remote esophageal dilation in the past. Still with intermittent dysphagia at times. No issues with GERD currently. Rare dicyclomine. Out of date. Nausea moreso related to not eating all day at work. Stricter with blood glucose over the past month.     Past Medical History:  Diagnosis Date   Anxiety    Depression    Diabetes mellitus without complication ([facility])    GERD (gastroesophageal reflux disease)    H/O hiatal hernia    Hypercholesterolemia    Hypertension     Past Surgical History:  Procedure Laterality Date   ANAL FISTULECTOMY  06/18/2011   Procedure: FISTULECTOMY ANAL;  Surgeon: Brent C Ziegler, MD;  Location: [facility];  Service: General;  Laterality: Right;   APPENDECTOMY  1997   Dr Bradford-[facility]   CHOLECYSTECTOMY     Dr Bradford_[facility]   HERNIA REPAIR     left-[place]    Current Outpatient Medications  Medication Sig Dispense Refill   ALPRAZolam (XANAX) 0.5 MG tablet Take 0.25 mg by mouth daily as needed for anxiety.      desvenlafaxine (PRISTIQ) 50 MG 24 hr tablet Take 50 mg by mouth daily.     dicyclomine (BENTYL) 10 MG capsule Take 1 capsule (10 mg total) by mouth 4 (four) times daily -  before meals and at bedtime. (Patient taking differently: Take 10 mg by mouth daily as needed for spasms.) 120 capsule 3   ergocalciferol (VITAMIN D2) 1.25 MG (50000 UT) capsule Take 50,000 Units by mouth once a week.     levocetirizine (XYZAL) 5 MG tablet Take one tablet daily as needed. 30 tablet 2   losartan-hydrochlorothiazide (HYZAAR) 100-25 MG tablet Take 1 tablet by mouth daily.     oxymetazoline (AFRIN) 0.05 % nasal spray Place 1 spray into both nostrils 2 (two) times daily as needed for congestion.     rosuvastatin (CRESTOR) 5 MG tablet Take 5 mg by mouth daily.     SitaGLIPtin-MetFORMIN HCl (JANUMET XR) 50-1000 MG TB24 Take 1 tablet by mouth daily.     No current facility-administered medications for this visit.    Allergies as of 02/14/2022   (No Known Allergies)    Family History  Problem Relation Age of Onset   Hypertension Father    COPD Father    Lung cancer Father    Hypertension Sister    Hypertension Mother    Heart disease Mother    Asthma Mother      Colon cancer Paternal Grandmother        diagnosed in his 60s.    Allergic rhinitis Neg Hx    Angioedema Neg Hx    Eczema Neg Hx    Immunodeficiency Neg Hx    Urticaria Neg Hx    Inflammatory bowel disease Neg Hx     Social History   Socioeconomic History   Marital status: Married    Spouse name: Not on file   Number of children: 2   Years of education: Not on file   Highest education level: Not on file  Occupational History   Not on file  Tobacco Use   Smoking status: Former    Packs/day: 1.00    Years: 3.00    Total pack years: 3.00    Types: Cigarettes    Quit date: 06/12/2005    Years since quitting: 16.6   Smokeless tobacco: Current    Types: Chew  Vaping Use   Vaping Use: Never used  Substance and Sexual Activity    Alcohol use: Yes    Comment: rarely   Drug use: Not Currently    Types: Marijuana    Comment: About 20-30 years ago.    Sexual activity: Yes    Birth control/protection: None  Other Topics Concern   Not on file  Social History Narrative   Not on file   Social Determinants of Health   Financial Resource Strain: Not on file  Food Insecurity: Not on file  Transportation Needs: Not on file  Physical Activity: Not on file  Stress: Not on file  Social Connections: Not on file  Intimate Partner Violence: Not on file     Review of Systems   Gen: Denies any fever, chills, fatigue, weight loss, lack of appetite.  CV: Denies chest pain, heart palpitations, peripheral edema, syncope.  Resp: Denies shortness of breath at rest or with exertion. Denies wheezing or cough.  GI: see HPI GU : Denies urinary burning, urinary frequency, urinary hesitancy MS: Denies joint pain, muscle weakness, cramps, or limitation of movement.  Derm: Denies rash, itching, dry skin Psych: Denies depression, anxiety, memory loss, and confusion Heme: Denies bruising, bleeding, and enlarged lymph nodes.   Physical Exam   BP 132/88 (BP Location: Left Arm, Patient Position: Sitting, Cuff Size: Large)   Pulse 80   Temp 98.7 F (37.1 C) (Temporal)   Ht 6' 2" (1.88 m)   Wt 225 lb 3.2 oz (102.2 kg)   BMI 28.91 kg/m  General:   Alert and oriented. Pleasant and cooperative. Well-nourished and well-developed.  Head:  Normocephalic and atraumatic. Eyes:  Without icterus Ears:  Normal auditory acuity. Lungs:  Clear to auscultation bilaterally.  Heart:  S1, S2 present without murmurs appreciated.  Abdomen:  +BS, soft, non-tender and non-distended. No HSM noted. No guarding or rebound. No masses appreciated.  Rectal:  Deferred  Msk:  Symmetrical without gross deformities. Normal posture. Extremities:  Without edema. Neurologic:  Alert and  oriented x4;  grossly normal neurologically. Skin:  Intact without  significant lesions or rashes. Psych:  Alert and cooperative. Normal mood and affect.   Assessment   Andrew Campbell is a 52 y.o. male presenting today  to arrange routine colonoscopy and EGD for dysphagia. He was last seen in 2022. He is unsure, but thinks he may have had colon polyps in his 20s. No first degree relatives with history of colon cancer.  Long-standing history of IBS with looser stools depending on dietary choices and   well-managed with rare dicyclomine. No alarm signs/symptoms.   Dysphagia: notes dilation in the distant past with good results, approximately in his 20s. No GERD symptoms. Pursue EGD/dilation in the near future.      PLAN   Proceed with colonoscopy/EGD/dilation by Dr. Rourk in near future: the risks, benefits, and alternatives have been discussed with the patient in detail. The patient states understanding and desires to proceed.  Refills on dicyclomine provided  Further recommendations to follow    Havanna Groner W. Abeer Iversen, PhD, ANP-BC [facility]    

## 2022-02-22 MED ORDER — CLENPIQ 10-3.5-12 MG-GM -GM/175ML PO SOLN: 1.0000 | ORAL | 0 refills | Status: AC

## 2022-02-22 NOTE — Telephone Encounter (Signed)
Spoke with pt. He has been scheduled for 3/4 at 12pm. Aware will send instructions via mychart. Aware needs labs prior to procedure. Pt requested low volume prep. Rx sent.

## 2022-03-09 ENCOUNTER — Other Ambulatory Visit (HOSPITAL_COMMUNITY)
Admission: RE | Admit: 2022-03-09 | Discharge: 2022-03-09 | Disposition: A | Discharge status: Home / Self Care | Payer: BC Managed Care – PPO | Source: Ambulatory Visit | Attending: Internal Medicine | Admitting: Internal Medicine

## 2022-03-09 DIAGNOSIS — Z800 Family history of malignant neoplasm of digestive organs: Secondary | ICD-10-CM | POA: Diagnosis not present

## 2022-03-09 DIAGNOSIS — F32A Depression, unspecified: Secondary | ICD-10-CM | POA: Diagnosis not present

## 2022-03-09 DIAGNOSIS — R1310 Dysphagia, unspecified: Secondary | ICD-10-CM | POA: Diagnosis not present

## 2022-03-09 DIAGNOSIS — K5730 Diverticulosis of large intestine without perforation or abscess without bleeding: Secondary | ICD-10-CM | POA: Diagnosis not present

## 2022-03-09 DIAGNOSIS — K589 Irritable bowel syndrome without diarrhea: Secondary | ICD-10-CM | POA: Diagnosis not present

## 2022-03-09 DIAGNOSIS — F419 Anxiety disorder, unspecified: Secondary | ICD-10-CM | POA: Diagnosis not present

## 2022-03-09 DIAGNOSIS — K449 Diaphragmatic hernia without obstruction or gangrene: Secondary | ICD-10-CM | POA: Diagnosis not present

## 2022-03-09 DIAGNOSIS — Z1211 Encounter for screening for malignant neoplasm of colon: Secondary | ICD-10-CM

## 2022-03-09 DIAGNOSIS — I10 Essential (primary) hypertension: Secondary | ICD-10-CM | POA: Diagnosis not present

## 2022-03-09 DIAGNOSIS — K2100 Gastro-esophageal reflux disease with esophagitis, without bleeding: Secondary | ICD-10-CM | POA: Diagnosis not present

## 2022-03-09 DIAGNOSIS — Z79899 Other long term (current) drug therapy: Secondary | ICD-10-CM | POA: Diagnosis not present

## 2022-03-09 DIAGNOSIS — K222 Esophageal obstruction: Secondary | ICD-10-CM | POA: Diagnosis not present

## 2022-03-09 DIAGNOSIS — Z7984 Long term (current) use of oral hypoglycemic drugs: Secondary | ICD-10-CM | POA: Diagnosis not present

## 2022-03-09 DIAGNOSIS — E7800 Pure hypercholesterolemia, unspecified: Secondary | ICD-10-CM | POA: Diagnosis not present

## 2022-03-09 DIAGNOSIS — Z87891 Personal history of nicotine dependence: Secondary | ICD-10-CM | POA: Diagnosis not present

## 2022-03-09 DIAGNOSIS — E119 Type 2 diabetes mellitus without complications: Secondary | ICD-10-CM | POA: Diagnosis not present

## 2022-03-09 DIAGNOSIS — G4730 Sleep apnea, unspecified: Secondary | ICD-10-CM | POA: Diagnosis not present

## 2022-03-09 DIAGNOSIS — Z9049 Acquired absence of other specified parts of digestive tract: Secondary | ICD-10-CM | POA: Diagnosis not present

## 2022-03-09 LAB — BASIC METABOLIC PANEL
Anion gap: 6 (ref 5–15)
BUN: 14 mg/dL (ref 6–20)
CO2: 28 mmol/L (ref 22–32)
Calcium: 8.9 mg/dL (ref 8.9–10.3)
Chloride: 101 mmol/L (ref 98–111)
Creatinine, Ser: 0.92 mg/dL (ref 0.61–1.24)
GFR, Estimated: >60 mL/min (ref >60)
Glucose, Bld: 263 mg/dL — ABNORMAL HIGH (ref 70–99)
Potassium: 3.9 mmol/L (ref 3.5–5.1)
Sodium: 135 mmol/L (ref 135–145)

## 2022-03-12 ENCOUNTER — Encounter (HOSPITAL_COMMUNITY): Payer: Self-pay | Admitting: Internal Medicine

## 2022-03-12 ENCOUNTER — Telehealth: Payer: Self-pay

## 2022-03-12 ENCOUNTER — Other Ambulatory Visit: Payer: Self-pay

## 2022-03-12 ENCOUNTER — Ambulatory Visit (HOSPITAL_COMMUNITY)
Admission: RE | Admit: 2022-03-12 | Discharge: 2022-03-12 | Disposition: A | Discharge status: Home / Self Care | Payer: BC Managed Care – PPO | Attending: Internal Medicine | Admitting: Internal Medicine

## 2022-03-12 ENCOUNTER — Ambulatory Visit (HOSPITAL_COMMUNITY): Payer: BC Managed Care – PPO | Admitting: Certified Registered"

## 2022-03-12 ENCOUNTER — Encounter (HOSPITAL_COMMUNITY)
Admission: RE | Disposition: A | Discharge status: Home / Self Care | Payer: Self-pay | Source: Home / Self Care | Attending: Internal Medicine

## 2022-03-12 DIAGNOSIS — E119 Type 2 diabetes mellitus without complications: Secondary | ICD-10-CM | POA: Insufficient documentation

## 2022-03-12 DIAGNOSIS — Z87891 Personal history of nicotine dependence: Secondary | ICD-10-CM | POA: Diagnosis not present

## 2022-03-12 DIAGNOSIS — K5730 Diverticulosis of large intestine without perforation or abscess without bleeding: Secondary | ICD-10-CM | POA: Diagnosis not present

## 2022-03-12 DIAGNOSIS — Z7984 Long term (current) use of oral hypoglycemic drugs: Secondary | ICD-10-CM | POA: Diagnosis not present

## 2022-03-12 DIAGNOSIS — Z79899 Other long term (current) drug therapy: Secondary | ICD-10-CM | POA: Diagnosis not present

## 2022-03-12 DIAGNOSIS — K2100 Gastro-esophageal reflux disease with esophagitis, without bleeding: Secondary | ICD-10-CM | POA: Insufficient documentation

## 2022-03-12 DIAGNOSIS — Z9049 Acquired absence of other specified parts of digestive tract: Secondary | ICD-10-CM | POA: Insufficient documentation

## 2022-03-12 DIAGNOSIS — E7800 Pure hypercholesterolemia, unspecified: Secondary | ICD-10-CM | POA: Insufficient documentation

## 2022-03-12 DIAGNOSIS — K589 Irritable bowel syndrome without diarrhea: Secondary | ICD-10-CM | POA: Insufficient documentation

## 2022-03-12 DIAGNOSIS — K449 Diaphragmatic hernia without obstruction or gangrene: Secondary | ICD-10-CM | POA: Diagnosis not present

## 2022-03-12 DIAGNOSIS — K222 Esophageal obstruction: Secondary | ICD-10-CM | POA: Diagnosis not present

## 2022-03-12 DIAGNOSIS — R1310 Dysphagia, unspecified: Secondary | ICD-10-CM | POA: Diagnosis not present

## 2022-03-12 DIAGNOSIS — I10 Essential (primary) hypertension: Secondary | ICD-10-CM | POA: Diagnosis not present

## 2022-03-12 DIAGNOSIS — Z1211 Encounter for screening for malignant neoplasm of colon: Secondary | ICD-10-CM | POA: Insufficient documentation

## 2022-03-12 DIAGNOSIS — F32A Depression, unspecified: Secondary | ICD-10-CM | POA: Diagnosis not present

## 2022-03-12 DIAGNOSIS — G4730 Sleep apnea, unspecified: Secondary | ICD-10-CM | POA: Insufficient documentation

## 2022-03-12 DIAGNOSIS — Z800 Family history of malignant neoplasm of digestive organs: Secondary | ICD-10-CM | POA: Diagnosis not present

## 2022-03-12 DIAGNOSIS — F419 Anxiety disorder, unspecified: Secondary | ICD-10-CM | POA: Insufficient documentation

## 2022-03-12 HISTORY — PX: MALONEY DILATION: SHX5535

## 2022-03-12 HISTORY — PX: COLONOSCOPY WITH PROPOFOL: SHX5780

## 2022-03-12 HISTORY — PX: ESOPHAGOGASTRODUODENOSCOPY (EGD) WITH PROPOFOL: SHX5813

## 2022-03-12 LAB — GLUCOSE, CAPILLARY: Glucose-Capillary: 162 mg/dL — ABNORMAL HIGH (ref 70–99)

## 2022-03-12 SURGERY — COLONOSCOPY WITH PROPOFOL
Anesthesia: General

## 2022-03-12 MED ORDER — LIDOCAINE HCL (CARDIAC) PF 100 MG/5ML IV SOSY
PREFILLED_SYRINGE | INTRAVENOUS | Status: DC | PRN
  Administered 2022-03-12: 50 mg via INTRAVENOUS

## 2022-03-12 MED ORDER — LACTATED RINGERS IV SOLN: INTRAVENOUS | Status: DC

## 2022-03-12 MED ORDER — STERILE WATER FOR IRRIGATION IR SOLN
Status: DC | PRN
  Administered 2022-03-12: .6 mL

## 2022-03-12 MED ORDER — PROPOFOL 10 MG/ML IV BOLUS
INTRAVENOUS | Status: DC | PRN
  Administered 2022-03-12: 100 mg via INTRAVENOUS
  Administered 2022-03-12 (×2): 50 mg via INTRAVENOUS

## 2022-03-12 MED ORDER — PANTOPRAZOLE SODIUM 40 MG PO TBEC: 40.0000 mg | DELAYED_RELEASE_TABLET | Freq: Every day | ORAL | 11 refills | Status: AC

## 2022-03-12 MED ORDER — PROPOFOL 500 MG/50ML IV EMUL
INTRAVENOUS | Status: DC | PRN
  Administered 2022-03-12: 150 ug/kg/min via INTRAVENOUS

## 2022-03-12 MED ORDER — PHENYLEPHRINE 80 MCG/ML (10ML) SYRINGE FOR IV PUSH (FOR BLOOD PRESSURE SUPPORT)
PREFILLED_SYRINGE | INTRAVENOUS | Status: DC | PRN
  Administered 2022-03-12 (×2): 80 ug via INTRAVENOUS

## 2022-03-12 MED ORDER — PHENYLEPHRINE 80 MCG/ML (10ML) SYRINGE FOR IV PUSH (FOR BLOOD PRESSURE SUPPORT)
PREFILLED_SYRINGE | INTRAVENOUS | Status: AC
  Filled 2022-03-12: qty 10

## 2022-03-12 NOTE — Interval H&P Note (Signed)
History and Physical Interval Note:  03/12/2022 10:52 AM  Andrew Campbell  has presented today for surgery, with the diagnosis of SCREENING COLONOSCOPY, DYSPHAGIA.  The various methods of treatment have been discussed with the patient and family. After consideration of risks, benefits and other options for treatment, the patient has consented to  Procedure(s) with comments: COLONOSCOPY WITH PROPOFOL (N/A) - 12:00pm, asa 2 ESOPHAGOGASTRODUODENOSCOPY (EGD) WITH PROPOFOL (N/A) MALONEY DILATION (N/A) as a surgical intervention.  The patient's history has been reviewed, patient examined, no change in status, stable for surgery.  I have reviewed the patient's chart and labs.  Questions were answered to the patient's satisfaction.     Sundra Haddix   no change.  EGD with esophageal dilation is feasible/appropriate per plan and screening colonoscopy as previously orchestrated. .rmr

## 2022-03-12 NOTE — Transfer of Care (Signed)
Immediate Anesthesia Transfer of Care Note  Patient: Andrew Campbell  Procedure(s) Performed: COLONOSCOPY WITH PROPOFOL ESOPHAGOGASTRODUODENOSCOPY (EGD) WITH PROPOFOL MALONEY DILATION  Patient Location: Endoscopy Unit  Anesthesia Type:General  Level of Consciousness: drowsy  Airway & Oxygen Therapy: Patient Spontanous Breathing  Post-op Assessment: Report given to RN and Post -op Vital signs reviewed and stable  Post vital signs: Reviewed and stable  Last Vitals:  Vitals Value Taken Time  BP    Temp    Pulse    Resp    SpO2      Last Pain:  Vitals:   03/12/22 1053  TempSrc: Oral  PainSc: 0-No pain      Patients Stated Pain Goal: 8 ([id number])  Complications: No notable events documented.

## 2022-03-12 NOTE — Op Note (Signed)
[facility] Patient Name: Andrew Campbell Procedure Date: 03/12/2022 10:48 AM MRN: [id number] Date of Birth: 1970/11/14 Attending MD: Norvel Richards , MD, [id number] CSN: [id number] Age: 52 Admit Type: Outpatient Procedure:                Colonoscopy Indications:              Screening for colorectal malignant neoplasm Providers:                Norvel Richards, MD, Janeece Riggers, RN, Raphael Gibney, Technician Referring MD:              Medicines:                Propofol per Anesthesia Complications:            No immediate complications. Estimated Blood Loss:     Estimated blood loss: none. Procedure:                Pre-Anesthesia Assessment:                           - Prior to the procedure, a History and Physical                            was performed, and patient medications and                            allergies were reviewed. The patient's tolerance of                            previous anesthesia was also reviewed. The risks                            and benefits of the procedure and the sedation                            options and risks were discussed with the patient.                            All questions were answered, and informed consent                            was obtained. Prior Anticoagulants: The patient has                            taken no anticoagulant or antiplatelet agents. ASA                            Grade Assessment: II - A patient with mild systemic                            disease. After reviewing the risks and benefits,  the patient was deemed in satisfactory condition to                            undergo the procedure.                           After obtaining informed consent, the colonoscope                            was passed under direct vision. Throughout the                            procedure, the patient's blood pressure, pulse, and                             oxygen saturations were monitored continuously. The                            [id number]) scope was introduced through the                            anus and advanced to the the cecum, identified by                            appendiceal orifice and ileocecal valve. The                            colonoscopy was performed without difficulty. The                            patient tolerated the procedure well. The quality                            of the bowel preparation was adequate. The entire                            colon was well visualized. Scope In: 11:21:38 AM Scope Out: 11:34:54 AM Scope Withdrawal Time: 0 hours 9 minutes 37 seconds  Total Procedure Duration: 0 hours 13 minutes 16 seconds  Findings:      The perianal and digital rectal examinations were normal.      Multiple medium-mouthed diverticula were found in the entire colon.      The exam was otherwise without abnormality on direct and retroflexion       views. Impression:               - Diverticulosis in the entire examined colon.                           - The examination was otherwise normal on direct                            and retroflexion views.                           - No specimens  collected. Moderate Sedation:      Moderate (conscious) sedation was personally administered by an       anesthesia professional. The following parameters were monitored: oxygen       saturation, heart rate, blood pressure, respiratory rate, EKG, adequacy       of pulmonary ventilation, and response to care. Recommendation:           - Patient has a contact number available for                            emergencies. The signs and symptoms of potential                            delayed complications were discussed with the                            patient. Return to normal activities tomorrow.                            Written discharge instructions were provided to the                            patient.                            - Advance diet as tolerated.                           - Continue present medications.                           - Repeat colonoscopy in 10 years for screening                            purposes.                           - Return to GI office in 2 months. Procedure Code(s):        --- Professional ---                           [phone], Colonoscopy, flexible; diagnostic, including                            collection of specimen(s) by brushing or washing,                            when performed (separate procedure) Diagnosis Code(s):        --- Professional ---                           Z12.11, Encounter for screening for malignant                            neoplasm of colon  K57.30, Diverticulosis of large intestine without                            perforation or abscess without bleeding CPT copyright 2022 American Medical Association. All rights reserved. The codes documented in this report are preliminary and upon coder review may  be revised to meet current compliance requirements. [name]. Shalice Woodring, MD Norvel Richards, MD 03/12/2022 11:46:00 AM This report has been signed electronically. Number of Addenda: 0

## 2022-03-12 NOTE — Telephone Encounter (Signed)
Rx was sent to pharmacy on file.  

## 2022-03-12 NOTE — Op Note (Signed)
[facility] Patient Name: Andrew Campbell Procedure Date: 03/12/2022 10:48 AM MRN: [id number] Date of Birth: Jan 10, 1970 Attending MD: Norvel Richards , MD, [id number] CSN: [id number] Age: 52 Admit Type: Outpatient Procedure:                Upper GI endoscopy Indications:              Dysphagia Providers:                Norvel Richards, MD, Janeece Riggers, RN, Raphael Gibney, Technician Referring MD:              Medicines:                Propofol per Anesthesia Complications:            No immediate complications. Estimated Blood Loss:     Estimated blood loss: none. Estimated blood loss:                            none. Procedure:                Pre-Anesthesia Assessment:                           - Prior to the procedure, a History and Physical                            was performed, and patient medications and                            allergies were reviewed. The patient's tolerance of                            previous anesthesia was also reviewed. The risks                            and benefits of the procedure and the sedation                            options and risks were discussed with the patient.                            All questions were answered, and informed consent                            was obtained. Prior Anticoagulants: The patient has                            taken no anticoagulant or antiplatelet agents. ASA                            Grade Assessment: II - A patient with mild systemic  disease. After reviewing the risks and benefits,                            the patient was deemed in satisfactory condition to                            undergo the procedure.                           After obtaining informed consent, the endoscope was                            passed under direct vision. Throughout the                            procedure, the patient's blood pressure, pulse, and                             oxygen saturations were monitored continuously. The                            [id number] scope was introduced through the                            mouth, and advanced to the second part of duodenum.                            The upper GI endoscopy was accomplished without                            difficulty. The patient tolerated the procedure                            well. Scope In: 11:05:54 AM Scope Out: 11:14:49 AM Total Procedure Duration: 0 hours 8 minutes 55 seconds  Findings:      A mild Schatzki ring was found at the gastroesophageal junction. erosive       reflux esophagitis within 5 mm of the GE junction The scope was       withdrawn. Dilation was performed with a Maloney dilator with mild       resistance at 56 Fr. The dilation site was examined following endoscope       reinsertion and showed no change. Estimated blood loss: none.      A small hiatal hernia was present.      stomach was otherwise without abnormality.      The duodenal bulb and second portion of the duodenum were normal. Impression:               - Mild Schatzki ring.                           - Small hiatal hernia. erosive reflux esophagitis                            esophagus dilated.                           -  Normal duodenal bulb and second portion of the                            duodenum.                           - No specimens collected. Moderate Sedation:      Moderate (conscious) sedation was personally administered by an       anesthesia professional. The following parameters were monitored: oxygen       saturation, heart rate, blood pressure, respiratory rate, EKG, adequacy       of pulmonary ventilation, and response to care. Recommendation:           - Patient has a contact number available for                            emergencies. The signs and symptoms of potential                            delayed complications were discussed with the                             patient. Return to normal activities tomorrow.                            Written discharge instructions were provided to the                            patient.                           - Advance diet as tolerated.                           - Continue present medications. Begin Protonix 40                            mg once daily 30 minutes before breakfast.                           - Return to my office in 2 months. Procedure Code(s):        --- Professional ---                           [phone], Esophagogastroduodenoscopy, flexible,                            transoral; diagnostic, including collection of                            specimen(s) by brushing or washing, when performed                            (separate procedure)                           43450,  Dilation of esophagus, by unguided sound or                            bougie, single or multiple passes Diagnosis Code(s):        --- Professional ---                           K22.2, Esophageal obstruction                           K44.9, Diaphragmatic hernia without obstruction or                            gangrene                           R13.10, Dysphagia, unspecified CPT copyright 2022 American Medical Association. All rights reserved. The codes documented in this report are preliminary and upon coder review may  be revised to meet current compliance requirements. [name]. [name], MD Norvel Richards, MD 03/12/2022 11:44:00 AM This report has been signed electronically. Number of Addenda: 0

## 2022-03-12 NOTE — Discharge Instructions (Addendum)
  Colonoscopy Discharge Instructions  Read the instructions outlined below and refer to this sheet in the next few weeks. These discharge instructions provide you with general information on caring for yourself after you leave the hospital. Your doctor may also give you specific instructions. While your treatment has been planned according to the most current medical practices available, unavoidable complications occasionally occur. If you have any problems or questions after discharge, call Dr. Gala Romney at [phone]. ACTIVITY You may resume your regular activity, but move at a slower pace for the next 24 hours.  Take frequent rest periods for the next 24 hours.  Walking will help get rid of the air and reduce the bloated feeling in your belly (abdomen).  No driving for 24 hours (because of the medicine (anesthesia) used during the test).   Do not sign any important legal documents or operate any machinery for 24 hours (because of the anesthesia used during the test).  NUTRITION Drink plenty of fluids.  You may resume your normal diet as instructed by your doctor.  Begin with a light meal and progress to your normal diet. Heavy or fried foods are harder to digest and may make you feel sick to your stomach (nauseated).  Avoid alcoholic beverages for 24 hours or as instructed.  MEDICATIONS You may resume your normal medications unless your doctor tells you otherwise.  WHAT YOU CAN EXPECT TODAY Some feelings of bloating in the abdomen.  Passage of more gas than usual.  Spotting of blood in your stool or on the toilet paper.  IF YOU HAD POLYPS REMOVED DURING THE COLONOSCOPY: No aspirin products for 7 days or as instructed.  No alcohol for 7 days or as instructed.  Eat a soft diet for the next 24 hours.  FINDING OUT THE RESULTS OF YOUR TEST Not all test results are available during your visit. If your test results are not back during the visit, make an appointment with your caregiver to find out the  results. Do not assume everything is normal if you have not heard from your caregiver or the medical facility. It is important for you to follow up on all of your test results.  SEEK IMMEDIATE MEDICAL ATTENTION IF: You have more than a spotting of blood in your stool.  Your belly is swollen (abdominal distention).  You are nauseated or vomiting.  You have a temperature over 101.  You have abdominal pain or discomfort that is severe or gets worse throughout the day.     You have diverticulosis.  No polyps.  Diverticulosis information provided    patient states he already knows he has diverticulosis  Recommend repeat colonoscopy for screening purposes in 10 years   you had a narrowing in your esophagus (Schatzki's ring) and acid reflux irritation   your esophagus was stretched   you need to take an acid blocker medication every day.  New prescription for Protonix or pantoprazole 40 mg tablet take 1 daily 30 minutes before breakfast.  New prescription being provided to your pharmacy from our office.    Office visit in 2 months       ********OFFICE TO CONTACT PATIENT OR CHECK MYCHART FOR FOLLOW UP APPOINTMENT************    At patient request, I called Laith Ebersold at [phone] findings and recommendations

## 2022-03-12 NOTE — Telephone Encounter (Signed)
-----   Message from Daneil Dolin, MD sent at 03/12/2022 11:46 AM EST -----  patient has reflux esophagitis and a Schatzki's ring dilated today.  Needs Protonix 40 mg tablet dispense 30 with 11 refills take (1) 30 minutes before breakfast daily.

## 2022-03-12 NOTE — Anesthesia Preprocedure Evaluation (Signed)
Anesthesia Evaluation  Patient identified by MRN, date of birth, ID band Patient awake    Reviewed: Allergy & Precautions, H&P , NPO status , Patient's Chart, lab work & pertinent test results, reviewed documented beta blocker date and time   Airway Mallampati: II  TM Distance: >3 FB Neck ROM: full    Dental no notable dental hx.    Pulmonary neg pulmonary ROS, sleep apnea , former smoker   Pulmonary exam normal breath sounds clear to auscultation       Cardiovascular Exercise Tolerance: Good hypertension, negative cardio ROS  Rhythm:regular Rate:Normal     Neuro/Psych  PSYCHIATRIC DISORDERS Anxiety Depression    negative neurological ROS  negative psych ROS   GI/Hepatic negative GI ROS, Neg liver ROS, hiatal hernia,GERD  Medicated,,  Endo/Other  negative endocrine ROSdiabetes    Renal/GU negative Renal ROS  negative genitourinary   Musculoskeletal   Abdominal   Peds  Hematology negative hematology ROS (+)   Anesthesia Other Findings   Reproductive/Obstetrics negative OB ROS                             Anesthesia Physical Anesthesia Plan  ASA: 2  Anesthesia Plan: General   Post-op Pain Management:    Induction:   PONV Risk Score and Plan: Propofol infusion  Airway Management Planned:   Additional Equipment:   Intra-op Plan:   Post-operative Plan:   Informed Consent: I have reviewed the patients History and Physical, chart, labs and discussed the procedure including the risks, benefits and alternatives for the proposed anesthesia with the patient or authorized representative who has indicated his/her understanding and acceptance.     Dental Advisory Given  Plan Discussed with: CRNA  Anesthesia Plan Comments:        Anesthesia Quick Evaluation

## 2022-03-14 NOTE — Anesthesia Postprocedure Evaluation (Signed)
Anesthesia Post Note  Patient: Andrew Campbell  Procedure(s) Performed: COLONOSCOPY WITH PROPOFOL ESOPHAGOGASTRODUODENOSCOPY (EGD) WITH PROPOFOL [place]  Patient location during evaluation: Phase II Anesthesia Type: General Level of consciousness: awake Pain management: pain level controlled Vital Signs Assessment: post-procedure vital signs reviewed and stable Respiratory status: spontaneous breathing and respiratory function stable Cardiovascular status: blood pressure returned to baseline and stable Postop Assessment: no headache and no apparent nausea or vomiting Anesthetic complications: no Comments: Late entry   No notable events documented.   Last Vitals:  Vitals:   03/12/22 1140 03/12/22 1144  BP:  111/62  Pulse: 78 78  Resp: (!) 24 20  Temp:    SpO2: 95% 96%    Last Pain:  Vitals:   03/12/22 1144  TempSrc:   PainSc: 0-No pain                 Louann Sjogren

## 2022-03-23 ENCOUNTER — Encounter (HOSPITAL_COMMUNITY): Payer: Self-pay | Admitting: Internal Medicine

## 2022-04-14 DIAGNOSIS — S61211A Laceration without foreign body of left index finger without damage to nail, initial encounter: Secondary | ICD-10-CM | POA: Diagnosis not present

## 2022-04-14 DIAGNOSIS — S61219D Laceration without foreign body of unspecified finger without damage to nail, subsequent encounter: Secondary | ICD-10-CM | POA: Diagnosis not present

## 2022-04-14 DIAGNOSIS — W260XXD Contact with knife, subsequent encounter: Secondary | ICD-10-CM | POA: Diagnosis not present

## 2022-04-18 DIAGNOSIS — I10 Essential (primary) hypertension: Secondary | ICD-10-CM | POA: Diagnosis not present

## 2022-04-18 DIAGNOSIS — E1165 Type 2 diabetes mellitus with hyperglycemia: Secondary | ICD-10-CM | POA: Diagnosis not present

## 2022-04-18 DIAGNOSIS — E559 Vitamin D deficiency, unspecified: Secondary | ICD-10-CM | POA: Diagnosis not present

## 2022-04-26 DIAGNOSIS — E1169 Type 2 diabetes mellitus with other specified complication: Secondary | ICD-10-CM | POA: Diagnosis not present

## 2022-04-26 DIAGNOSIS — I10 Essential (primary) hypertension: Secondary | ICD-10-CM | POA: Diagnosis not present

## 2022-04-26 DIAGNOSIS — E669 Obesity, unspecified: Secondary | ICD-10-CM | POA: Diagnosis not present

## 2022-04-26 DIAGNOSIS — E782 Mixed hyperlipidemia: Secondary | ICD-10-CM | POA: Diagnosis not present

## 2022-05-15 ENCOUNTER — Encounter: Payer: Self-pay | Admitting: Gastroenterology

## 2022-05-15 ENCOUNTER — Ambulatory Visit: Payer: BC Managed Care – PPO | Admitting: Gastroenterology

## 2023-05-15 ENCOUNTER — Encounter (INDEPENDENT_AMBULATORY_CARE_PROVIDER_SITE_OTHER): Payer: Self-pay

## 2023-05-15 ENCOUNTER — Other Ambulatory Visit (HOSPITAL_COMMUNITY): Payer: Self-pay | Admitting: Nurse Practitioner

## 2023-05-15 DIAGNOSIS — Z122 Encounter for screening for malignant neoplasm of respiratory organs: Secondary | ICD-10-CM

## 2023-05-22 ENCOUNTER — Ambulatory Visit (HOSPITAL_COMMUNITY)
Admission: RE | Admit: 2023-05-22 | Discharge: 2023-05-22 | Disposition: A | Discharge status: Home / Self Care | Source: Ambulatory Visit | Attending: Nurse Practitioner | Admitting: Nurse Practitioner

## 2023-05-22 DIAGNOSIS — Z122 Encounter for screening for malignant neoplasm of respiratory organs: Secondary | ICD-10-CM | POA: Diagnosis present

## 2023-06-15 ENCOUNTER — Ambulatory Visit

## 2023-07-07 NOTE — Progress Notes (Unsigned)
 Andrew Campbell, male    DOB: Jul 13, 1970    MRN: [id number]   Brief patient profile:  53  yowm  quit 2015  referred to pulmonary clinic in [place]  07/08/2023 by Dr Shona  with onset of recurrent sinus infections 2-3 x per year typically ends up as bronchitis more severe x 2025 last episode.    Pt not previously seen by PCCM service.    Allergy eval at [facility] 2018 everyting   LDSCT  05/22/23 Centrilobular and paraseptal emphysema evident. A mixed density solid and ground-glass nodule is identified in the parahilar right upper lobe (109/4) with solid nodular component measuring 6.3 mm.  History of Present Illness  07/08/2023  Pulmonary/ 1st office eval/ [facility] / [place] [facility]  Chief Complaint  Patient presents with   Establish Care  Dyspnea:  very active landscaping business  Cough: minimal clear / using afrin daily x one year/ flonase x 10days Sleep: flat couch with sev pillows = custom  SABA use: not at time of ov 02: none  LDSCT:none   No obvious day to day or daytime pattern/variability or assoc excess/ purulent sputum or mucus plugs or hemoptysis or cp or chest tightness, subjective wheeze or overt sinus or hb symptoms.    Also denies any obvious fluctuation of symptoms with weather or environmental changes or other aggravating or alleviating factors except as outlined above   No unusual exposure hx or h/o childhood pna/ asthma or knowledge of premature birth.  Current Allergies, Complete Past Medical History, Past Surgical History, Family History, and Social History were reviewed in [employer] record.  ROS  The following are not active complaints unless bolded Hoarseness, sore throat, dysphagia, dental problems, itching, sneezing,  nasal congestion or discharge of excess mucus or purulent secretions, ear ache,   fever, chills, sweats, unintended wt loss or wt gain, classically pleuritic or exertional cp,  orthopnea pnd or arm/hand swelling  or  leg swelling, presyncope, palpitations, abdominal pain, anorexia, nausea, vomiting, diarrhea  or change in bowel habits or change in bladder habits, change in stools or change in urine, dysuria, hematuria,  rash, arthralgias, visual complaints, headache, numbness, weakness or ataxia or problems with walking or coordination,  change in mood or  memory.            Outpatient Medications Prior to Visit  Medication Sig Dispense Refill   albuterol  (VENTOLIN  HFA) 108 (90 Base) MCG/ACT inhaler Inhale 2 puffs into the lungs every 4 (four) hours as needed.     ALPRAZolam (XANAX) 0.5 MG tablet Take 0.25 mg by mouth daily as needed for anxiety.     desvenlafaxine (PRISTIQ) 50 MG 24 hr tablet Take 50 mg by mouth daily.     dicyclomine  (BENTYL ) 10 MG capsule Take 1 capsule (10 mg total) by mouth daily as needed for spasms. 90 capsule 3   levocetirizine (XYZAL ) 5 MG tablet Take one tablet daily as needed. 30 tablet 2   losartan -hydrochlorothiazide (HYZAAR) 100-25 MG tablet Take 1 tablet by mouth daily.     pantoprazole  (PROTONIX ) 40 MG tablet Take 1 tablet (40 mg total) by mouth daily. (Patient not taking: Reported on 07/08/2023) 30 tablet 11   rosuvastatin (CRESTOR) 5 MG tablet Take 5 mg by mouth daily.     saxagliptin HCl (ONGLYZA) 2.5 MG TABS tablet Take 5 mg by mouth daily.     SitaGLIPtin-MetFORMIN HCl (JANUMET XR) 50-1000 MG TB24 Take 1 tablet by mouth daily.     ergocalciferol (  VITAMIN D2) 1.25 MG (50000 UT) capsule Take 50,000 Units by mouth once a week. (Patient not taking: Reported on 07/08/2023)     oxymetazoline (AFRIN) 0.05 % nasal spray Place 1 spray into both nostrils 2 (two) times daily as needed for congestion. (Patient not taking: Reported on 07/08/2023)     Sod Picosulfate-Mag Ox-Cit Acd (CLENPIQ ) 10-3.5-12 MG-GM -GM/175ML SOLN Take 1 kit by mouth as directed. (Patient not taking: Reported on 07/08/2023) 350 mL 0   No facility-administered medications prior to visit.    Past Medical History:   Diagnosis Date   Anxiety    Depression    Diabetes mellitus without complication (HCC)    GERD (gastroesophageal reflux disease)    H/O hiatal hernia    Hypercholesterolemia    Hypertension       Objective:     BP (!) 178/98   Pulse 72   Ht 6' 2 (1.88 m)   Wt 220 lb 12.8 oz (100.2 kg)   SpO2 99% Comment: RA  BMI 28.35 kg/m   SpO2: 99 % (RA)        Assessment   No problem-specific Assessment & Plan notes found for this encounter.     Ozell America, MD 07/08/2023

## 2023-07-08 ENCOUNTER — Ambulatory Visit: Payer: Self-pay | Admitting: Internal Medicine

## 2023-07-08 ENCOUNTER — Encounter: Payer: Self-pay | Admitting: Internal Medicine

## 2023-07-08 VITALS — BP 178/98 | HR 72 | Ht 74.0 in | Wt 220.8 lb

## 2023-07-08 DIAGNOSIS — R911 Solitary pulmonary nodule: Secondary | ICD-10-CM | POA: Insufficient documentation

## 2023-07-08 DIAGNOSIS — Z87891 Personal history of nicotine dependence: Secondary | ICD-10-CM

## 2023-07-08 DIAGNOSIS — J310 Chronic rhinitis: Secondary | ICD-10-CM | POA: Diagnosis not present

## 2023-07-08 DIAGNOSIS — J438 Other emphysema: Secondary | ICD-10-CM | POA: Diagnosis not present

## 2023-07-08 NOTE — Patient Instructions (Addendum)
 I emphasized that nasal steroids (flonase)  have no immediate benefit in terms of improving symptoms.  To help them reached the target tissue, the patient should use Afrin two puffs every 12 hours applied one min before using the nasal steroids.  Afrin should be stopped after no more than 5 days.  If the symptoms worsen, Afrin can be restarted after 5 days off of therapy to prevent rebound congestion from overuse of Afrin.  I also emphasized that in no way are nasal steroids a concern in terms of addiction.   My office will be contacting you by phone for referral for follow up CT chest for Mid August  2025   - if you don't hear back from my office within one week please call us  back or notify us  thru MyChart and we'll address it right away.   When you are coughing :  Try prilosec otc 20mg  (or your protonix  40 mg )   Take 30-60 min before first meal of the day and Pepcid ac (famotidine) 20 mg one @  bedtime until cough is completely gone for at least a week without the need for cough suppression  For cough> mucinex dm up to 1200 mg every 12 hours as needed  Please remember to go to the lab department   for your tests - we will call you with the results when they are available.     Pulmonary follow up is as needed

## 2023-07-09 DIAGNOSIS — J310 Chronic rhinitis: Secondary | ICD-10-CM | POA: Insufficient documentation

## 2023-07-09 NOTE — Assessment & Plan Note (Signed)
 Onset in his early 40s > overuse of afrin noted 07/08/2023  - rec flonase maint rx/ f/u ent prn 07/08/2023 >>>  Comment: I emphasized that nasal steroids have no immediate benefit in terms of improving symptoms.  To help them reached the target tissue, the patient should use Afrin two puffs every 12 hours applied one min before using the nasal steroids.  Afrin should be stopped after no more than 5 days.  If the symptoms worsen, Afrin can be restarted after 5 days off of therapy to prevent rebound congestion from overuse of Afrin.  I also emphasized that in no way are nasal steroids a concern in terms of addiction.          Each maintenance medication was reviewed in detail including emphasizing most importantly the difference between maintenance and prns and under what circumstances the prns are to be triggered using an action plan format where appropriate.  Total time for H and P, chart review, counseling, reviewing nasal device(s) and generating customized AVS unique to this office visit / same day charting = 50 min new pt eval

## 2023-07-09 NOTE — Assessment & Plan Note (Signed)
 Stopped smoking 2015 - Centrilobular and paraseptal emphysema evident on [facility]CT 05/22/23   Clinically minimal copd, no flares or limiting doe, so no need for rx     I reviewed the Fletcher curve with the patient that basically indicates that  if you quit smoking when your best day FEV1 is still well preserved (as is clearly   the case here)  it is highly unlikely you will progress to severe disease and informed the patient there was  no medication on the market that has proven to alter the curve/ its downward trajectory  or the likelihood of progression of their disease(unlike other chronic medical conditions such as atheroclerosis where we do think we can change the natural hx with risk reducing meds)    Therefore stopping   maintaining abstinence are  the most important aspects of his care, not choice of inhalers or for that matter, Pulmonary doctors.   Treatment other than smoking cessation  is entirely directed by severity of symptoms and focused also on reducing exacerbations, not attempting to change the natural history of the disease. Should either issue become problematic, then escalation of maintenance and prn resp meds may be warranted the punishment needs to fit the crime

## 2023-07-09 NOTE — Assessment & Plan Note (Signed)
 See [facility]  05/22/23 mixed density solid and ground-glass nodule is identified in the parahilar right upper lobe with solid nodular component measuring 6.3 mm - F/u ct mid August 2025 ordered 07/08/2023   This scan was done while symptomatic with one of his sinusitis flares assoc with cough but not a classic aecopd which he denied experiencing but nonetheless could all be related to infection and not neoplasm.  For now, CT results reviewed with pt >>> Too small for PET or bx, not suspicious enough for excisional bx > really only option for now is follow the Fleischner society guidelines as rec by radiology > 3 m f/u  CT ordered  Discussed in detail all the  indications, usual  risks and alternatives  relative to the benefits with patient who agrees to proceed with w/u as outlined.

## 2023-07-11 ENCOUNTER — Ambulatory Visit: Payer: Self-pay | Admitting: Internal Medicine

## 2023-07-11 LAB — ALPHA-1-ANTITRYPSIN PHENOTYP: A-1 Antitrypsin: 127 mg/dL (ref 101–187)

## 2023-07-11 NOTE — Progress Notes (Signed)
 Spoke with pt regarding labs, confirmed understanding NFN

## 2023-08-21 ENCOUNTER — Ambulatory Visit: Admitting: Internal Medicine

## 2023-08-22 DIAGNOSIS — F320 Major depressive disorder, single episode, mild: Secondary | ICD-10-CM | POA: Diagnosis not present

## 2023-08-23 ENCOUNTER — Ambulatory Visit (HOSPITAL_COMMUNITY)
Admission: RE | Admit: 2023-08-23 | Discharge: 2023-08-23 | Disposition: A | Discharge status: Home / Self Care | Payer: Self-pay | Source: Ambulatory Visit | Attending: Internal Medicine | Admitting: Internal Medicine

## 2023-08-23 DIAGNOSIS — J432 Centrilobular emphysema: Secondary | ICD-10-CM | POA: Diagnosis not present

## 2023-08-23 DIAGNOSIS — I700 Atherosclerosis of aorta: Secondary | ICD-10-CM | POA: Diagnosis not present

## 2023-08-23 DIAGNOSIS — R7301 Impaired fasting glucose: Secondary | ICD-10-CM | POA: Diagnosis not present

## 2023-08-23 DIAGNOSIS — E559 Vitamin D deficiency, unspecified: Secondary | ICD-10-CM | POA: Diagnosis not present

## 2023-08-23 DIAGNOSIS — E1169 Type 2 diabetes mellitus with other specified complication: Secondary | ICD-10-CM | POA: Diagnosis not present

## 2023-08-23 DIAGNOSIS — R911 Solitary pulmonary nodule: Secondary | ICD-10-CM | POA: Insufficient documentation

## 2023-08-23 DIAGNOSIS — E782 Mixed hyperlipidemia: Secondary | ICD-10-CM | POA: Diagnosis not present

## 2023-08-23 DIAGNOSIS — R891 Abnormal level of hormones in specimens from other organs, systems and tissues: Secondary | ICD-10-CM | POA: Diagnosis not present

## 2023-08-26 DIAGNOSIS — I10 Essential (primary) hypertension: Secondary | ICD-10-CM | POA: Diagnosis not present

## 2023-08-26 DIAGNOSIS — E782 Mixed hyperlipidemia: Secondary | ICD-10-CM | POA: Diagnosis not present

## 2023-08-26 DIAGNOSIS — F411 Generalized anxiety disorder: Secondary | ICD-10-CM | POA: Diagnosis not present

## 2023-08-26 DIAGNOSIS — E1169 Type 2 diabetes mellitus with other specified complication: Secondary | ICD-10-CM | POA: Diagnosis not present

## 2023-08-26 DIAGNOSIS — R891 Abnormal level of hormones in specimens from other organs, systems and tissues: Secondary | ICD-10-CM | POA: Diagnosis not present

## 2023-09-04 DIAGNOSIS — U071 COVID-19: Secondary | ICD-10-CM | POA: Diagnosis not present

## 2023-09-04 DIAGNOSIS — R062 Wheezing: Secondary | ICD-10-CM | POA: Diagnosis not present

## 2023-09-10 ENCOUNTER — Telehealth: Payer: Self-pay

## 2023-09-10 NOTE — Telephone Encounter (Signed)
 Copied from CRM [id number]. Topic: Clinical - Lab/Test Results >> Sep 05, 2023  4:06 PM Andrew Campbell wrote: Reason for CRM: Pt is calling for the results of his LCS CT from 8/15. I do see the results have yet to be added to the pt's chart. Please call the pt once the results are in at [phone] ok to leave a vm.  Please advise

## 2023-09-25 DIAGNOSIS — I10 Essential (primary) hypertension: Secondary | ICD-10-CM | POA: Diagnosis not present

## 2023-09-25 DIAGNOSIS — E1169 Type 2 diabetes mellitus with other specified complication: Secondary | ICD-10-CM | POA: Diagnosis not present

## 2023-09-25 DIAGNOSIS — F411 Generalized anxiety disorder: Secondary | ICD-10-CM | POA: Diagnosis not present

## 2023-10-03 DIAGNOSIS — F320 Major depressive disorder, single episode, mild: Secondary | ICD-10-CM | POA: Diagnosis not present

## 2023-10-10 DIAGNOSIS — F320 Major depressive disorder, single episode, mild: Secondary | ICD-10-CM | POA: Diagnosis not present

## 2023-10-28 DIAGNOSIS — F320 Major depressive disorder, single episode, mild: Secondary | ICD-10-CM | POA: Diagnosis not present

## 2023-11-06 DIAGNOSIS — R911 Solitary pulmonary nodule: Secondary | ICD-10-CM | POA: Diagnosis not present

## 2023-11-06 DIAGNOSIS — E1169 Type 2 diabetes mellitus with other specified complication: Secondary | ICD-10-CM | POA: Diagnosis not present

## 2023-11-06 DIAGNOSIS — F411 Generalized anxiety disorder: Secondary | ICD-10-CM | POA: Diagnosis not present

## 2023-11-06 DIAGNOSIS — I10 Essential (primary) hypertension: Secondary | ICD-10-CM | POA: Diagnosis not present

## 2023-12-16 ENCOUNTER — Other Ambulatory Visit (HOSPITAL_COMMUNITY): Payer: Self-pay | Admitting: Nurse Practitioner

## 2023-12-16 DIAGNOSIS — R932 Abnormal findings on diagnostic imaging of liver and biliary tract: Secondary | ICD-10-CM

## 2023-12-23 ENCOUNTER — Ambulatory Visit (HOSPITAL_COMMUNITY)
Admission: RE | Admit: 2023-12-23 | Discharge: 2023-12-23 | Attending: Nurse Practitioner | Admitting: Nurse Practitioner

## 2023-12-23 DIAGNOSIS — R932 Abnormal findings on diagnostic imaging of liver and biliary tract: Secondary | ICD-10-CM

## 2023-12-23 DIAGNOSIS — K760 Fatty (change of) liver, not elsewhere classified: Secondary | ICD-10-CM | POA: Diagnosis not present

## 2023-12-23 DIAGNOSIS — Z9049 Acquired absence of other specified parts of digestive tract: Secondary | ICD-10-CM | POA: Diagnosis not present

## 2023-12-26 DIAGNOSIS — F320 Major depressive disorder, single episode, mild: Secondary | ICD-10-CM | POA: Diagnosis not present

## 2024-01-06 DIAGNOSIS — E559 Vitamin D deficiency, unspecified: Secondary | ICD-10-CM | POA: Diagnosis not present

## 2024-01-06 DIAGNOSIS — E1169 Type 2 diabetes mellitus with other specified complication: Secondary | ICD-10-CM | POA: Diagnosis not present

## 2024-01-06 DIAGNOSIS — E782 Mixed hyperlipidemia: Secondary | ICD-10-CM | POA: Diagnosis not present
